# Patient Record
Sex: Female | Born: 1975 | Race: Black or African American | Hispanic: No | Marital: Single | Smoking: Never smoker
Health system: Southern US, Community
[De-identification: ages and names within clinical notes are randomized; demographics above are authoritative.]

## PROBLEM LIST (undated history)

## (undated) DIAGNOSIS — A5403 Gonococcal cervicitis, unspecified: Secondary | ICD-10-CM

## (undated) DIAGNOSIS — L301 Dyshidrosis [pompholyx]: Secondary | ICD-10-CM

## (undated) DIAGNOSIS — N3941 Urge incontinence: Secondary | ICD-10-CM

## (undated) DIAGNOSIS — F32A Depression, unspecified: Secondary | ICD-10-CM

## (undated) DIAGNOSIS — L700 Acne vulgaris: Secondary | ICD-10-CM

## (undated) DIAGNOSIS — N760 Acute vaginitis: Secondary | ICD-10-CM

## (undated) DIAGNOSIS — A64 Unspecified sexually transmitted disease: Secondary | ICD-10-CM

## (undated) DIAGNOSIS — F329 Major depressive disorder, single episode, unspecified: Secondary | ICD-10-CM

## (undated) DIAGNOSIS — B9689 Other specified bacterial agents as the cause of diseases classified elsewhere: Secondary | ICD-10-CM

## (undated) HISTORY — DX: Unspecified sexually transmitted disease: A64

## (undated) HISTORY — DX: Depression, unspecified: F32.A

## (undated) HISTORY — DX: Acne vulgaris: L70.0

## (undated) HISTORY — DX: Other specified bacterial agents as the cause of diseases classified elsewhere: B96.89

## (undated) HISTORY — DX: Major depressive disorder, single episode, unspecified: F32.9

## (undated) HISTORY — DX: Dyshidrosis (pompholyx): L30.1

## (undated) HISTORY — DX: Acute vaginitis: N76.0

## (undated) HISTORY — DX: Urge incontinence: N39.41

## (undated) HISTORY — DX: Gonococcal cervicitis, unspecified: A54.03

---

## 1998-05-23 ENCOUNTER — Inpatient Hospital Stay (HOSPITAL_COMMUNITY): Admission: AD | Admit: 1998-05-23 | Discharge: 1998-05-23 | Payer: Self-pay | Admitting: *Deleted

## 1998-05-25 ENCOUNTER — Inpatient Hospital Stay (HOSPITAL_COMMUNITY): Admission: RE | Admit: 1998-05-25 | Discharge: 1998-05-25 | Payer: Self-pay | Admitting: Obstetrics & Gynecology

## 1998-12-24 ENCOUNTER — Inpatient Hospital Stay (HOSPITAL_COMMUNITY): Admission: AD | Admit: 1998-12-24 | Discharge: 1998-12-24 | Payer: Self-pay | Admitting: Obstetrics & Gynecology

## 1999-01-08 ENCOUNTER — Inpatient Hospital Stay (HOSPITAL_COMMUNITY): Admission: AD | Admit: 1999-01-08 | Discharge: 1999-01-08 | Payer: Self-pay | Admitting: Obstetrics & Gynecology

## 1999-01-08 ENCOUNTER — Encounter: Payer: Self-pay | Admitting: Obstetrics & Gynecology

## 1999-01-11 ENCOUNTER — Inpatient Hospital Stay (HOSPITAL_COMMUNITY): Admission: AD | Admit: 1999-01-11 | Discharge: 1999-01-11 | Payer: Self-pay | Admitting: Obstetrics

## 1999-01-20 ENCOUNTER — Emergency Department (HOSPITAL_COMMUNITY): Admission: EM | Admit: 1999-01-20 | Discharge: 1999-01-20 | Payer: Self-pay | Admitting: Emergency Medicine

## 1999-02-10 ENCOUNTER — Inpatient Hospital Stay (HOSPITAL_COMMUNITY): Admission: AD | Admit: 1999-02-10 | Discharge: 1999-02-10 | Payer: Self-pay | Admitting: Obstetrics

## 1999-03-21 ENCOUNTER — Other Ambulatory Visit: Admission: RE | Admit: 1999-03-21 | Discharge: 1999-03-21 | Payer: Self-pay | Admitting: Obstetrics & Gynecology

## 1999-04-29 ENCOUNTER — Ambulatory Visit (HOSPITAL_COMMUNITY): Admission: RE | Admit: 1999-04-29 | Discharge: 1999-04-29 | Payer: Self-pay | Admitting: *Deleted

## 1999-06-01 ENCOUNTER — Observation Stay (HOSPITAL_COMMUNITY): Admission: AD | Admit: 1999-06-01 | Discharge: 1999-06-02 | Payer: Self-pay | Admitting: Obstetrics

## 1999-06-02 ENCOUNTER — Encounter: Payer: Self-pay | Admitting: *Deleted

## 1999-07-21 ENCOUNTER — Inpatient Hospital Stay (HOSPITAL_COMMUNITY): Admission: AD | Admit: 1999-07-21 | Discharge: 1999-07-21 | Payer: Self-pay | Admitting: *Deleted

## 1999-08-15 ENCOUNTER — Observation Stay (HOSPITAL_COMMUNITY): Admission: AD | Admit: 1999-08-15 | Discharge: 1999-08-15 | Payer: Self-pay | Admitting: Obstetrics & Gynecology

## 1999-08-15 ENCOUNTER — Encounter: Payer: Self-pay | Admitting: Obstetrics & Gynecology

## 1999-08-24 ENCOUNTER — Inpatient Hospital Stay (HOSPITAL_COMMUNITY): Admission: AD | Admit: 1999-08-24 | Discharge: 1999-08-24 | Payer: Self-pay | Admitting: Obstetrics & Gynecology

## 1999-08-26 ENCOUNTER — Inpatient Hospital Stay (HOSPITAL_COMMUNITY): Admission: AD | Admit: 1999-08-26 | Discharge: 1999-09-01 | Payer: Self-pay | Admitting: Obstetrics & Gynecology

## 1999-08-30 ENCOUNTER — Encounter: Payer: Self-pay | Admitting: Obstetrics & Gynecology

## 1999-09-17 ENCOUNTER — Emergency Department (HOSPITAL_COMMUNITY): Admission: EM | Admit: 1999-09-17 | Discharge: 1999-09-17 | Payer: Self-pay | Admitting: Emergency Medicine

## 2000-01-04 ENCOUNTER — Encounter: Admission: RE | Admit: 2000-01-04 | Discharge: 2000-01-04 | Payer: Self-pay | Admitting: Obstetrics & Gynecology

## 2000-08-16 ENCOUNTER — Emergency Department (HOSPITAL_COMMUNITY): Admission: EM | Admit: 2000-08-16 | Discharge: 2000-08-16 | Payer: Self-pay | Admitting: Emergency Medicine

## 2001-02-27 ENCOUNTER — Emergency Department (HOSPITAL_COMMUNITY): Admission: EM | Admit: 2001-02-27 | Discharge: 2001-02-27 | Payer: Self-pay

## 2001-03-20 ENCOUNTER — Encounter (INDEPENDENT_AMBULATORY_CARE_PROVIDER_SITE_OTHER): Payer: Self-pay | Admitting: *Deleted

## 2001-03-20 ENCOUNTER — Encounter: Admission: RE | Admit: 2001-03-20 | Discharge: 2001-03-20 | Payer: Self-pay | Admitting: Obstetrics & Gynecology

## 2001-05-08 ENCOUNTER — Encounter: Admission: RE | Admit: 2001-05-08 | Discharge: 2001-05-08 | Payer: Self-pay | Admitting: Obstetrics & Gynecology

## 2001-05-23 ENCOUNTER — Emergency Department (HOSPITAL_COMMUNITY): Admission: EM | Admit: 2001-05-23 | Discharge: 2001-05-23 | Payer: Self-pay | Admitting: Emergency Medicine

## 2001-08-11 ENCOUNTER — Emergency Department (HOSPITAL_COMMUNITY): Admission: EM | Admit: 2001-08-11 | Discharge: 2001-08-11 | Payer: Self-pay | Admitting: Emergency Medicine

## 2001-08-22 ENCOUNTER — Inpatient Hospital Stay (HOSPITAL_COMMUNITY): Admission: AD | Admit: 2001-08-22 | Discharge: 2001-08-22 | Payer: Self-pay | Admitting: Obstetrics

## 2001-11-21 ENCOUNTER — Emergency Department (HOSPITAL_COMMUNITY): Admission: EM | Admit: 2001-11-21 | Discharge: 2001-11-21 | Payer: Self-pay | Admitting: *Deleted

## 2001-11-21 ENCOUNTER — Encounter: Payer: Self-pay | Admitting: Orthopedic Surgery

## 2001-11-25 ENCOUNTER — Inpatient Hospital Stay (HOSPITAL_COMMUNITY): Admission: RE | Admit: 2001-11-25 | Discharge: 2001-11-26 | Payer: Self-pay | Admitting: Orthopedic Surgery

## 2001-12-04 ENCOUNTER — Encounter: Admission: RE | Admit: 2001-12-04 | Discharge: 2002-02-18 | Payer: Self-pay | Admitting: Orthopedic Surgery

## 2002-01-21 ENCOUNTER — Encounter: Payer: Self-pay | Admitting: Family Medicine

## 2002-01-21 ENCOUNTER — Encounter: Admission: RE | Admit: 2002-01-21 | Discharge: 2002-01-21 | Payer: Self-pay | Admitting: Family Medicine

## 2002-06-03 ENCOUNTER — Emergency Department (HOSPITAL_COMMUNITY): Admission: EM | Admit: 2002-06-03 | Discharge: 2002-06-03 | Payer: Self-pay | Admitting: Emergency Medicine

## 2003-04-18 ENCOUNTER — Inpatient Hospital Stay (HOSPITAL_COMMUNITY): Admission: AD | Admit: 2003-04-18 | Discharge: 2003-04-18 | Payer: Self-pay | Admitting: *Deleted

## 2003-06-12 ENCOUNTER — Encounter: Admission: RE | Admit: 2003-06-12 | Discharge: 2003-06-12 | Payer: Self-pay | Admitting: Obstetrics and Gynecology

## 2003-06-23 ENCOUNTER — Ambulatory Visit (HOSPITAL_COMMUNITY): Admission: RE | Admit: 2003-06-23 | Discharge: 2003-06-23 | Payer: Self-pay | Admitting: Family Medicine

## 2003-06-24 ENCOUNTER — Inpatient Hospital Stay (HOSPITAL_COMMUNITY): Admission: AD | Admit: 2003-06-24 | Discharge: 2003-06-24 | Payer: Self-pay | Admitting: *Deleted

## 2003-10-12 ENCOUNTER — Inpatient Hospital Stay (HOSPITAL_COMMUNITY): Admission: AD | Admit: 2003-10-12 | Discharge: 2003-10-12 | Payer: Self-pay | Admitting: Family Medicine

## 2003-11-16 ENCOUNTER — Inpatient Hospital Stay (HOSPITAL_COMMUNITY): Admission: AD | Admit: 2003-11-16 | Discharge: 2003-11-17 | Payer: Self-pay | Admitting: Family Medicine

## 2004-01-10 ENCOUNTER — Emergency Department (HOSPITAL_COMMUNITY): Admission: EM | Admit: 2004-01-10 | Discharge: 2004-01-10 | Payer: Self-pay | Admitting: Emergency Medicine

## 2004-04-16 ENCOUNTER — Inpatient Hospital Stay (HOSPITAL_COMMUNITY): Admission: AD | Admit: 2004-04-16 | Discharge: 2004-04-16 | Payer: Self-pay | Admitting: *Deleted

## 2004-06-22 HISTORY — PX: TUBAL LIGATION: SHX77

## 2005-05-05 ENCOUNTER — Emergency Department (HOSPITAL_COMMUNITY): Admission: EM | Admit: 2005-05-05 | Discharge: 2005-05-05 | Payer: Self-pay | Admitting: Emergency Medicine

## 2005-05-08 ENCOUNTER — Emergency Department (HOSPITAL_COMMUNITY): Admission: EM | Admit: 2005-05-08 | Discharge: 2005-05-08 | Payer: Self-pay | Admitting: Emergency Medicine

## 2005-05-19 ENCOUNTER — Inpatient Hospital Stay (HOSPITAL_COMMUNITY): Admission: EM | Admit: 2005-05-19 | Discharge: 2005-05-23 | Payer: Self-pay | Admitting: Emergency Medicine

## 2006-01-12 ENCOUNTER — Ambulatory Visit (HOSPITAL_COMMUNITY): Admission: RE | Admit: 2006-01-12 | Discharge: 2006-01-12 | Payer: Self-pay | Admitting: Internal Medicine

## 2006-01-12 ENCOUNTER — Ambulatory Visit: Payer: Self-pay | Admitting: Internal Medicine

## 2006-05-10 ENCOUNTER — Ambulatory Visit: Payer: Self-pay | Admitting: Hospitalist

## 2006-07-28 ENCOUNTER — Encounter (INDEPENDENT_AMBULATORY_CARE_PROVIDER_SITE_OTHER): Payer: Self-pay | Admitting: Infectious Diseases

## 2006-07-28 ENCOUNTER — Ambulatory Visit: Payer: Self-pay | Admitting: Internal Medicine

## 2006-07-28 ENCOUNTER — Encounter (INDEPENDENT_AMBULATORY_CARE_PROVIDER_SITE_OTHER): Payer: Self-pay | Admitting: *Deleted

## 2006-07-28 LAB — CONVERTED CEMR LAB
Beta hcg, urine, semiquantitative: NEGATIVE
Chlamydia, DNA Probe: NEGATIVE
GC Probe Amp, Genital: NEGATIVE
Pap Smear: NORMAL

## 2006-07-29 ENCOUNTER — Encounter (INDEPENDENT_AMBULATORY_CARE_PROVIDER_SITE_OTHER): Payer: Self-pay | Admitting: Infectious Diseases

## 2006-08-17 ENCOUNTER — Encounter (INDEPENDENT_AMBULATORY_CARE_PROVIDER_SITE_OTHER): Payer: Self-pay | Admitting: Infectious Diseases

## 2006-08-17 DIAGNOSIS — R51 Headache: Secondary | ICD-10-CM

## 2006-08-17 DIAGNOSIS — A5901 Trichomonal vulvovaginitis: Secondary | ICD-10-CM

## 2006-08-17 DIAGNOSIS — N760 Acute vaginitis: Secondary | ICD-10-CM | POA: Insufficient documentation

## 2006-08-17 DIAGNOSIS — R519 Headache, unspecified: Secondary | ICD-10-CM | POA: Insufficient documentation

## 2006-10-20 ENCOUNTER — Ambulatory Visit: Payer: Self-pay | Admitting: Internal Medicine

## 2006-10-21 ENCOUNTER — Encounter (INDEPENDENT_AMBULATORY_CARE_PROVIDER_SITE_OTHER): Payer: Self-pay | Admitting: *Deleted

## 2006-10-21 LAB — CONVERTED CEMR LAB
Candida species: NEGATIVE
Trichomonal Vaginitis: NEGATIVE

## 2006-10-24 ENCOUNTER — Encounter (INDEPENDENT_AMBULATORY_CARE_PROVIDER_SITE_OTHER): Payer: Self-pay | Admitting: *Deleted

## 2006-10-24 ENCOUNTER — Telehealth: Payer: Self-pay | Admitting: *Deleted

## 2007-01-10 ENCOUNTER — Ambulatory Visit: Payer: Self-pay | Admitting: Hospitalist

## 2007-01-10 ENCOUNTER — Encounter (INDEPENDENT_AMBULATORY_CARE_PROVIDER_SITE_OTHER): Payer: Self-pay | Admitting: *Deleted

## 2007-01-10 DIAGNOSIS — N898 Other specified noninflammatory disorders of vagina: Secondary | ICD-10-CM | POA: Insufficient documentation

## 2007-01-10 LAB — CONVERTED CEMR LAB
GC Probe Amp, Genital: NEGATIVE
Gardnerella vaginalis: POSITIVE — AB

## 2007-02-04 ENCOUNTER — Emergency Department (HOSPITAL_COMMUNITY): Admission: EM | Admit: 2007-02-04 | Discharge: 2007-02-04 | Payer: Self-pay | Admitting: Family Medicine

## 2007-08-19 ENCOUNTER — Emergency Department (HOSPITAL_COMMUNITY): Admission: EM | Admit: 2007-08-19 | Discharge: 2007-08-19 | Payer: Self-pay | Admitting: Emergency Medicine

## 2007-10-09 ENCOUNTER — Emergency Department (HOSPITAL_COMMUNITY): Admission: EM | Admit: 2007-10-09 | Discharge: 2007-10-09 | Payer: Self-pay | Admitting: Emergency Medicine

## 2007-11-15 ENCOUNTER — Ambulatory Visit: Payer: Self-pay | Admitting: Internal Medicine

## 2007-11-21 ENCOUNTER — Encounter: Admission: RE | Admit: 2007-11-21 | Discharge: 2007-12-24 | Payer: Self-pay | Admitting: Infectious Diseases

## 2007-12-10 ENCOUNTER — Telehealth: Payer: Self-pay | Admitting: *Deleted

## 2007-12-12 ENCOUNTER — Encounter (INDEPENDENT_AMBULATORY_CARE_PROVIDER_SITE_OTHER): Payer: Self-pay | Admitting: Infectious Diseases

## 2007-12-12 ENCOUNTER — Encounter (INDEPENDENT_AMBULATORY_CARE_PROVIDER_SITE_OTHER): Payer: Self-pay | Admitting: Internal Medicine

## 2007-12-12 ENCOUNTER — Ambulatory Visit: Payer: Self-pay | Admitting: Hospitalist

## 2007-12-12 LAB — CONVERTED CEMR LAB
Gardnerella vaginalis: POSITIVE — AB
Trichomonal Vaginitis: NEGATIVE

## 2007-12-13 LAB — CONVERTED CEMR LAB
Chlamydia, DNA Probe: NEGATIVE
GC Probe Amp, Genital: POSITIVE — AB

## 2007-12-26 ENCOUNTER — Ambulatory Visit: Payer: Self-pay | Admitting: Infectious Disease

## 2007-12-26 ENCOUNTER — Encounter (INDEPENDENT_AMBULATORY_CARE_PROVIDER_SITE_OTHER): Payer: Self-pay | Admitting: Infectious Diseases

## 2007-12-26 ENCOUNTER — Encounter (INDEPENDENT_AMBULATORY_CARE_PROVIDER_SITE_OTHER): Payer: Self-pay | Admitting: Internal Medicine

## 2007-12-26 DIAGNOSIS — A64 Unspecified sexually transmitted disease: Secondary | ICD-10-CM | POA: Insufficient documentation

## 2007-12-26 LAB — CONVERTED CEMR LAB
Hep B Core Total Ab: NEGATIVE
Hep B S Ab: NEGATIVE
Hepatitis B Surface Ag: NEGATIVE

## 2007-12-27 LAB — CONVERTED CEMR LAB: Gardnerella vaginalis: POSITIVE — AB

## 2008-01-09 ENCOUNTER — Telehealth (INDEPENDENT_AMBULATORY_CARE_PROVIDER_SITE_OTHER): Payer: Self-pay | Admitting: Infectious Diseases

## 2008-01-22 ENCOUNTER — Ambulatory Visit: Payer: Self-pay | Admitting: Internal Medicine

## 2008-01-22 ENCOUNTER — Encounter (INDEPENDENT_AMBULATORY_CARE_PROVIDER_SITE_OTHER): Payer: Self-pay | Admitting: *Deleted

## 2008-01-22 DIAGNOSIS — B353 Tinea pedis: Secondary | ICD-10-CM | POA: Insufficient documentation

## 2008-01-22 LAB — CONVERTED CEMR LAB
GC Probe Amp, Genital: NEGATIVE
Gardnerella vaginalis: POSITIVE — AB
Protein, U semiquant: NEGATIVE
Specific Gravity, Urine: >=1.03
Trichomonal Vaginitis: NEGATIVE
Urobilinogen, UA: 1

## 2008-01-23 ENCOUNTER — Telehealth (INDEPENDENT_AMBULATORY_CARE_PROVIDER_SITE_OTHER): Payer: Self-pay | Admitting: *Deleted

## 2008-02-26 ENCOUNTER — Emergency Department (HOSPITAL_COMMUNITY): Admission: EM | Admit: 2008-02-26 | Discharge: 2008-02-26 | Payer: Self-pay | Admitting: Emergency Medicine

## 2008-04-17 ENCOUNTER — Telehealth (INDEPENDENT_AMBULATORY_CARE_PROVIDER_SITE_OTHER): Payer: Self-pay | Admitting: Internal Medicine

## 2008-05-06 ENCOUNTER — Encounter (INDEPENDENT_AMBULATORY_CARE_PROVIDER_SITE_OTHER): Payer: Self-pay | Admitting: *Deleted

## 2008-05-06 ENCOUNTER — Ambulatory Visit: Payer: Self-pay | Admitting: Internal Medicine

## 2008-05-06 LAB — CONVERTED CEMR LAB

## 2008-06-05 ENCOUNTER — Encounter (INDEPENDENT_AMBULATORY_CARE_PROVIDER_SITE_OTHER): Payer: Self-pay | Admitting: Internal Medicine

## 2008-06-13 ENCOUNTER — Telehealth (INDEPENDENT_AMBULATORY_CARE_PROVIDER_SITE_OTHER): Payer: Self-pay | Admitting: Internal Medicine

## 2008-06-13 ENCOUNTER — Encounter (INDEPENDENT_AMBULATORY_CARE_PROVIDER_SITE_OTHER): Payer: Self-pay | Admitting: Internal Medicine

## 2008-07-24 ENCOUNTER — Telehealth (INDEPENDENT_AMBULATORY_CARE_PROVIDER_SITE_OTHER): Payer: Self-pay | Admitting: Internal Medicine

## 2008-08-01 ENCOUNTER — Ambulatory Visit: Payer: Self-pay | Admitting: Internal Medicine

## 2008-08-01 ENCOUNTER — Encounter (INDEPENDENT_AMBULATORY_CARE_PROVIDER_SITE_OTHER): Payer: Self-pay | Admitting: *Deleted

## 2008-08-06 LAB — CONVERTED CEMR LAB: Candida species: NEGATIVE

## 2008-09-03 ENCOUNTER — Telehealth (INDEPENDENT_AMBULATORY_CARE_PROVIDER_SITE_OTHER): Payer: Self-pay | Admitting: Internal Medicine

## 2008-10-03 ENCOUNTER — Telehealth (INDEPENDENT_AMBULATORY_CARE_PROVIDER_SITE_OTHER): Payer: Self-pay | Admitting: Internal Medicine

## 2008-11-11 ENCOUNTER — Ambulatory Visit: Payer: Self-pay | Admitting: Internal Medicine

## 2008-11-11 ENCOUNTER — Encounter (INDEPENDENT_AMBULATORY_CARE_PROVIDER_SITE_OTHER): Payer: Self-pay | Admitting: Internal Medicine

## 2008-11-11 ENCOUNTER — Encounter (INDEPENDENT_AMBULATORY_CARE_PROVIDER_SITE_OTHER): Payer: Self-pay | Admitting: *Deleted

## 2008-11-11 ENCOUNTER — Encounter: Payer: Self-pay | Admitting: Internal Medicine

## 2008-11-11 LAB — CONVERTED CEMR LAB
Chlamydia, DNA Probe: NEGATIVE
GC Probe Amp, Genital: NEGATIVE

## 2008-11-13 LAB — CONVERTED CEMR LAB
Gardnerella vaginalis: POSITIVE — AB
Trichomonal Vaginitis: POSITIVE — AB

## 2009-01-22 ENCOUNTER — Ambulatory Visit: Payer: Self-pay | Admitting: Internal Medicine

## 2009-02-03 ENCOUNTER — Encounter (INDEPENDENT_AMBULATORY_CARE_PROVIDER_SITE_OTHER): Payer: Self-pay | Admitting: Internal Medicine

## 2009-02-03 ENCOUNTER — Ambulatory Visit: Payer: Self-pay | Admitting: Internal Medicine

## 2009-02-03 LAB — CONVERTED CEMR LAB
Bilirubin Urine: NEGATIVE
Chlamydia, DNA Probe: NEGATIVE
GC Probe Amp, Genital: NEGATIVE
Hemoglobin, Urine: NEGATIVE
Ketones, ur: NEGATIVE mg/dL
Leukocytes, UA: NEGATIVE
Nitrite: NEGATIVE
Protein, ur: NEGATIVE mg/dL
Specific Gravity, Urine: 1.034 — ABNORMAL HIGH
Urine Glucose: NEGATIVE mg/dL
Urobilinogen, UA: 1
pH: 6

## 2009-02-17 LAB — CONVERTED CEMR LAB
Candida species: NEGATIVE
Trichomonal Vaginitis: NEGATIVE

## 2009-04-09 ENCOUNTER — Ambulatory Visit: Payer: Self-pay | Admitting: Internal Medicine

## 2009-04-13 LAB — CONVERTED CEMR LAB
Candida species: NEGATIVE
GC Probe Amp, Urine: NEGATIVE
Gardnerella vaginalis: POSITIVE — AB

## 2009-04-14 ENCOUNTER — Telehealth: Payer: Self-pay | Admitting: *Deleted

## 2009-04-14 ENCOUNTER — Telehealth: Payer: Self-pay | Admitting: Internal Medicine

## 2009-04-17 ENCOUNTER — Ambulatory Visit: Payer: Self-pay | Admitting: Internal Medicine

## 2009-06-03 ENCOUNTER — Emergency Department (HOSPITAL_COMMUNITY): Admission: EM | Admit: 2009-06-03 | Discharge: 2009-06-03 | Payer: Self-pay | Admitting: Emergency Medicine

## 2009-06-03 ENCOUNTER — Other Ambulatory Visit: Payer: Self-pay | Admitting: Obstetrics & Gynecology

## 2009-06-03 ENCOUNTER — Ambulatory Visit: Payer: Self-pay | Admitting: Advanced Practice Midwife

## 2009-06-05 ENCOUNTER — Ambulatory Visit: Payer: Self-pay | Admitting: Internal Medicine

## 2009-06-05 DIAGNOSIS — N390 Urinary tract infection, site not specified: Secondary | ICD-10-CM

## 2009-08-20 ENCOUNTER — Ambulatory Visit: Payer: Self-pay | Admitting: Internal Medicine

## 2009-08-21 ENCOUNTER — Encounter (INDEPENDENT_AMBULATORY_CARE_PROVIDER_SITE_OTHER): Payer: Self-pay | Admitting: Internal Medicine

## 2009-08-21 LAB — CONVERTED CEMR LAB
Bilirubin Urine: NEGATIVE
Protein, ur: NEGATIVE mg/dL
Urine Glucose: NEGATIVE mg/dL

## 2009-10-08 ENCOUNTER — Telehealth (INDEPENDENT_AMBULATORY_CARE_PROVIDER_SITE_OTHER): Payer: Self-pay | Admitting: Internal Medicine

## 2009-10-12 ENCOUNTER — Telehealth (INDEPENDENT_AMBULATORY_CARE_PROVIDER_SITE_OTHER): Payer: Self-pay | Admitting: Internal Medicine

## 2009-12-14 ENCOUNTER — Ambulatory Visit: Payer: Self-pay | Admitting: Internal Medicine

## 2009-12-14 LAB — CONVERTED CEMR LAB
Candida species: NEGATIVE
GC Probe Amp, Genital: NEGATIVE

## 2009-12-14 LAB — HM PAP SMEAR: HM Pap smear: NORMAL

## 2009-12-15 LAB — CONVERTED CEMR LAB: Pap Smear: NEGATIVE

## 2009-12-23 ENCOUNTER — Telehealth (INDEPENDENT_AMBULATORY_CARE_PROVIDER_SITE_OTHER): Payer: Self-pay | Admitting: Internal Medicine

## 2010-05-26 ENCOUNTER — Ambulatory Visit: Payer: Self-pay | Admitting: Internal Medicine

## 2010-05-26 DIAGNOSIS — F329 Major depressive disorder, single episode, unspecified: Secondary | ICD-10-CM | POA: Insufficient documentation

## 2010-05-26 LAB — CONVERTED CEMR LAB
AST: 10 units/L (ref 0–37)
Alkaline Phosphatase: 42 units/L (ref 39–117)
BUN: 12 mg/dL (ref 6–23)
Bilirubin Urine: NEGATIVE
Creatinine, Ser: 0.75 mg/dL (ref 0.40–1.20)
HCT: 37.1 % (ref 36.0–46.0)
Hemoglobin: 12.5 g/dL (ref 12.0–15.0)
MCHC: 33.7 g/dL (ref 30.0–36.0)
Nitrite: NEGATIVE
Potassium: 4.5 meq/L (ref 3.5–5.3)
RDW: 14.2 % (ref 11.5–15.5)
Specific Gravity, Urine: >=1.03
Urobilinogen, UA: 0.2

## 2010-08-31 ENCOUNTER — Telehealth: Payer: Self-pay | Admitting: *Deleted

## 2010-09-12 ENCOUNTER — Encounter: Payer: Self-pay | Admitting: Hospitalist

## 2010-09-23 NOTE — Progress Notes (Signed)
Summary: phone/gg  Phone Note Call from Patient   Caller: Patient Summary of Call: Pt needs another referral to  dermatoligist, Dr Terri Piedra for skin rash.  She is out of the cream she uses and the referral was in 09. Pt # [phone] Initial call taken by: Merrie Roof RN,  October 08, 2009 4:44 PM  Follow-up for Phone Call       Follow-up by: Jason Coop MD,  October 08, 2009 5:00 PM

## 2010-09-23 NOTE — Progress Notes (Signed)
Summary: med refill/gp  Phone Note Refill Request Message from:  Patient on October 12, 2009 4:35 PM  Pt. request refill on Clobetasol Propionate 0.5% cream.  She said her hands are doing good with the cream.  And does not need Derm. referral since the cream is working.   Method Requested: Electronic Initial call taken by: Chinita Pester RN,  October 12, 2009 4:36 PM  Follow-up for Phone Call       Follow-up by: Jason Coop MD,  October 12, 2009 5:04 PM    New/Updated Medications: CLOBETASOL PROPIONATE 0.05 % CREA (CLOBETASOL PROPIONATE) apply once daily on the affected areas Prescriptions: CLOBETASOL PROPIONATE 0.05 % CREA (CLOBETASOL PROPIONATE) apply once daily on the affected areas  #1 x 0   Entered and Authorized by:   Jason Coop MD   Signed by:   Jason Coop MD on 10/12/2009   Method used:   Electronically to        [facility] [id number] (retail)       [address] [place], [place]  [address]       Ph: [phone]       Fax: [phone]   RxID:   [id number]

## 2010-09-23 NOTE — Progress Notes (Signed)
Summary: med refill/gp  Phone Note Refill Request Message from:  Fax from Pharmacy on Dec 23, 2009 11:21 AM  Refills Requested: Medication #1:  CLOBETASOL PROPIONATE 0.05 % CREA apply once daily on the affected areas   Last Refilled: 09/23/2009  Method Requested: Electronic Initial call taken by: Chinita Pester RN,  Dec 23, 2009 11:21 AM  Follow-up for Phone Call       Follow-up by: Jason Coop MD,  Dec 24, 2009 10:03 AM    Prescriptions: CLOBETASOL PROPIONATE 0.05 % CREA (CLOBETASOL PROPIONATE) apply once daily on the affected areas  #1 x 0   Entered and Authorized by:   Jason Coop MD   Signed by:   Jason Coop MD on 12/24/2009   Method used:   Electronically to        [facility] [id number] (retail)       [address] [place], [place]  [address]       Ph: [phone]       Fax: [phone]   RxID:   [id number]

## 2010-09-23 NOTE — Progress Notes (Signed)
Summary: phone/gg  Phone Note From Pharmacy   Summary of Call: pharmacy would like to change FLAGYL ER 750 MG XR24H-TAB (METRONIDAZOLE) Take 1 tablet by mouth once a day  #7   to metronidazole 500 mg 1 & 1/2    twice a day .      Medicare will not cover the ER. Pt did not have enough money to pay for meds.  She had to get new medicaid card. Still having d/c   Initial call taken by: Merrie Roof RN,  August 31, 2010 10:43 AM  Follow-up for Phone Call        I changed rx as requested - nurse to complete. Follow-up by: Margarito Liner MD,  August 31, 2010 12:30 PM  Additional Follow-up for Phone Call Additional follow up Details #1::        Phone call completed, Pharmacist called Additional Follow-up by: Merrie Roof RN,  August 31, 2010 12:34 PM    New/Updated Medications: METRONIDAZOLE 500 MG TABS (METRONIDAZOLE) Take 1 tablet by mouth two times a day for 7 days.  Prescriptions: METRONIDAZOLE 500 MG TABS (METRONIDAZOLE) Take 1 tablet by mouth two times a day for 7 days.  #14 x 0   Entered and Authorized by:   Margarito Liner MD   Signed by:   Margarito Liner MD on 08/31/2010   Method used:   Telephoned to ...       [facility] [id number] (retail)       [address] [place], [place]  [address]       Ph: [phone]       Fax: [phone]   RxID:   [id number]
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
## 2010-11-12 ENCOUNTER — Ambulatory Visit (INDEPENDENT_AMBULATORY_CARE_PROVIDER_SITE_OTHER): Payer: Medicaid Other | Admitting: Internal Medicine

## 2010-11-12 ENCOUNTER — Encounter: Payer: Self-pay | Admitting: Internal Medicine

## 2010-11-12 VITALS — BP 128/75 | HR 83 | Temp 98.1°F | Ht 64.0 in | Wt 146.2 lb

## 2010-11-12 DIAGNOSIS — N760 Acute vaginitis: Secondary | ICD-10-CM

## 2010-11-12 DIAGNOSIS — A5403 Gonococcal cervicitis, unspecified: Secondary | ICD-10-CM

## 2010-11-12 DIAGNOSIS — A64 Unspecified sexually transmitted disease: Secondary | ICD-10-CM

## 2010-11-12 MED ORDER — CLOBETASOL PROPIONATE 0.05 % EX CREA: TOPICAL_CREAM | Freq: Every day | CUTANEOUS | Status: DC

## 2010-11-12 MED ORDER — METRONIDAZOLE 0.75 % VA GEL: 1.0000 | Freq: Every day | VAGINAL | Status: AC

## 2010-11-12 MED ORDER — HYDROCODONE-ACETAMINOPHEN 5-500 MG PO TABS: 1.0000 | ORAL_TABLET | Freq: Three times a day (TID) | ORAL | Status: DC | PRN

## 2010-11-12 NOTE — Assessment & Plan Note (Signed)
She has hx of multiple BV and  did not complete flagyl treatment. Vaginal exam shows whitish discharge, will give her flagy vaginal gel for 5 days.  Also will check GC/Chlamydial to rule out STD.

## 2010-11-12 NOTE — Assessment & Plan Note (Addendum)
She is a high risk for STD and hx of multiple BV, GC and CM. Vaginal exam shows whitish discharge. Since she has been has this and did not complete flagyl treatment, will give her flagyl vaginal gel for 5 days.  Will check GC/Chlamydial and advised her safe sex.   Addendum: Her vaginal discharge had positive chlamydia, so will start azithromycin 1 g for one dose.

## 2010-11-12 NOTE — Assessment & Plan Note (Signed)
Will check GC of vaginal discharge.

## 2010-11-12 NOTE — Patient Instructions (Signed)
Please take all your medications as instructed in your instructions.   We will call you if any abnormal lab results.      
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
## 2010-11-13 LAB — WET PREP BY MOLECULAR PROBE: Gardnerella vaginalis: POSITIVE — AB

## 2010-11-15 MED ORDER — AZITHROMYCIN 500 MG PO TABS: ORAL_TABLET | ORAL | Status: AC

## 2010-11-15 NOTE — Progress Notes (Signed)
Addended by: Jackson Latino on: 11/15/2010 01:37 PM   Modules accepted: Orders

## 2010-11-25 LAB — COMPREHENSIVE METABOLIC PANEL
AST: 16 U/L (ref 0–37)
BUN: 9 mg/dL (ref 6–23)
CO2: 31 mEq/L (ref 19–32)
Calcium: 9.2 mg/dL (ref 8.4–10.5)
Creatinine, Ser: 0.82 mg/dL (ref 0.4–1.2)
GFR calc Af Amer: >60 mL/min (ref >60)
GFR calc non Af Amer: >60 mL/min (ref >60)

## 2010-11-25 LAB — WET PREP, GENITAL
Clue Cells Wet Prep HPF POC: NONE SEEN
Trich, Wet Prep: NONE SEEN
Yeast Wet Prep HPF POC: NONE SEEN

## 2010-11-25 LAB — DIFFERENTIAL
Eosinophils Relative: 2 % (ref 0–5)
Lymphocytes Relative: 37 % (ref 12–46)
Lymphs Abs: 1.1 10*3/uL (ref 0.7–4.0)
Monocytes Relative: 10 % (ref 3–12)
Neutrophils Relative %: 50 % (ref 43–77)

## 2010-11-25 LAB — URINALYSIS, ROUTINE W REFLEX MICROSCOPIC
Ketones, ur: NEGATIVE mg/dL
Leukocytes, UA: NEGATIVE
Nitrite: POSITIVE — AB
Specific Gravity, Urine: >1.03 — ABNORMAL HIGH (ref 1.005–1.030)
pH: 5.5 (ref 5.0–8.0)

## 2010-11-25 LAB — GC/CHLAMYDIA PROBE AMP, GENITAL: GC Probe Amp, Genital: NEGATIVE

## 2010-11-25 LAB — CBC
MCHC: 34 g/dL (ref 30.0–36.0)
MCV: 89.5 fL (ref 78.0–100.0)
RBC: 4.46 MIL/uL (ref 3.87–5.11)

## 2010-11-25 LAB — URINE MICROSCOPIC-ADD ON

## 2010-12-05 ENCOUNTER — Encounter: Payer: Self-pay | Admitting: Internal Medicine
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
## 2011-01-07 NOTE — H&P (Signed)
[facility]  Patient:    [name], [name]                        MRN: [id number] Adm. Date:  [id number] Disc. Date: [id number] Attending:  Armanda Heritage                         History and Physical  CHIEF COMPLAINT:              Patient is a 35 year old para 2 who desires a sterilization procedure.  HISTORY OF PRESENT ILLNESS:   At present the patient is using Depo-Provera. She has a history of Norplant usage.  She is single.  The risks, benefits, and alternative forms of management were reviewed with the patient and informed consent was obtained.  She was counseled regarding the failure rate of 4-8 per 1000 cases with subsequent risk of an ectopic gestation.  ALLERGIES:                    No known drug allergies.  MEDICATIONS:                  None.  PAST OBSTETRICAL HISTORY:     She is status post two NSVDs.  PAST GYNECOLOGICAL HISTORY:   She has a history of pelvic inflammatory disease.  PAST MEDICAL HISTORY:         She denies.  PAST SURGICAL HISTORY:        As above.  FAMILY HISTORY:               Noncontributory.  SOCIAL HISTORY:               No tobacco, ethanol, or drug use.  PHYSICAL EXAMINATION  VITAL SIGNS:                  Pulse 93, blood pressure 128/66, weight 160.4 pounds.  GENERAL:                      Well-developed, well-nourished, no apparent distress.  HEENT:                        Normocephalic, atraumatic.  NECK:                         Supple.  LUNGS:                        Clear to auscultation.  HEART:                        Regular rate and rhythm.  ABDOMEN:                      Soft, nontender.  No organomegaly.  PELVIC:                       The external female genitalia are normal appearing.  On speculum examination there is scant white discharge.  Pap smear, GC/chlamydia probes, and a wet prep were sent.  Cervix was somewhat friable.  On bimanual examination the uterus is small, anteverted,  nontender. Adnexa are nonpalpable, nontender.  EXTREMITIES:                  No cyanosis, clubbing, or edema.  SKIN:                         Without rash.  ASSESSMENT:                   Multiparity, desires sterilization.  PLAN:                         Check Pap smear, GC/chlamydia probes, wet prep done today.  Procedure is laparoscopic bilateral tubal ligation. DD:  03/20/01 TD:  03/20/01 Job: [id number]
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
## 2011-01-07 NOTE — Op Note (Signed)
[facility]  Patient:    [name], [name] Visit Number: [id number] MRN: [id number]          Service Type: EMS Location: MINO Attending Physician:  Armanda Heritage Dictated by:   Roseanna Rainbow, M.D. Proc. Date: 05/03/01 Admit Date:  02/27/2001 Discharge Date: 02/27/2001                             Operative Report  NO DICTATION Dictated by:   Roseanna Rainbow, M.D. Attending Physician:  Armanda Heritage DD:  05/03/01 TD:  05/03/01 Job: [id number]

## 2011-01-07 NOTE — Discharge Summary (Signed)
[name], [name]               ACCOUNT NO.:  [id number]   MEDICAL RECORD NO.:  [id number]          PATIENT TYPE:  INP   LOCATION:  1314                         FACILITY:  [facility]   PHYSICIAN:  [name], M.D.      DATE OF BIRTH:  [birth year]   DATE OF ADMISSION:  05/19/2005  DATE OF DISCHARGE:  05/23/2005                                 DISCHARGE SUMMARY   PRIMARY CARE PHYSICIAN:  [name], M.D.   DISCHARGE DIAGNOSES:  1.  Acute suppurative otitis externi.  2.  Normocytic anemia.  3.  Transient hypertension.  4.  Transient hypokalemia.   DISCHARGE MEDICATIONS:  1.  Ciprodex otic 5 drops twice a day in the right ear for 3 whole days.  2.  Avelox 400 mg daily for 3 more days.  3.  Tylenol p.r.n.  4.  Ibuprofen p.r.n.   DISPOSITION:  The patient will be discharged in much better health with no  discharge from the ear. Still has some mild earache but mainly residual. She  will be scheduled to have an appointment with Dr. Jearld Ellis and Dr. Parke Ellis in 2-3  weeks.   PROCEDURES PERFORMED:  None.   CONSULTATIONS:  Dr. Jearld Ellis, ENT.   BRIEF HISTORY AND PHYSICAL:  Please refer to dictated history and physical  by Dr. Sherrie Mustache. In short, however, this is a 35 year old [place]  female with no significant past medical history except for some allergies.  She started having right earache pain and was treated as an outpatient. The  patient came in secondary to failing outpatient therapy. She was seen in the  ED. At the time afebrile. She had some right ear discharge, draining  yellowish-green pus. Also surrounding excoriations and mild erythema. Very  tender around the right ear and over the mastoid joint. She was subsequently  admitted for management of right ear pain secondary to suppurative otitis  media. Further labs at the time also showed that her potassium was 3.3,  otherwise her labs were okay.   HOSPITAL COURSE:  Problem 1.  Acute suppurative otitis externi. The patient  was initiated on IV Zosyn. She also had ENT consult. Dr. Jearld Ellis saw patient  and Ciprodex was also added. The patient seemed to have done well on this  antibiotic. At this point has improved tremendously. She continued to have  some slight fever up until May 20, 2005. She was subsequently told to  continue antibiotic for a total of 7-10 days as an outpatient. Her Zosyn is  now being switched to Avelox at the time of discharge.   Problem 2.  Hypokalemia. This is transient and no clear cause was found. Her  magnesium was 1.9. TSH was normal at 0.526. No nausea, vomiting or diarrhea.  The patient received potassium and that was repleted.   Problem 3.  Normocytic anemia. She had mild normocytic anemia with  hemoglobin 11.8. She was started on some multivitamins. The patient is at a  young age of menstruating and that may be responsible for that finding.   The patient will follow up with Dr. Jearld Ellis and Dr. Parke Ellis.  [name], M.D.  Electronically Signed     LG/MEDQ  D:  05/23/2005  T:  05/23/2005  Job:  [id number]   cc:   [name], M.D.  Fax: [phone]

## 2011-02-01 ENCOUNTER — Ambulatory Visit: Payer: Medicaid Other | Admitting: Internal Medicine

## 2011-02-08 ENCOUNTER — Ambulatory Visit: Payer: Medicaid Other | Admitting: Internal Medicine

## 2011-02-09 ENCOUNTER — Ambulatory Visit (INDEPENDENT_AMBULATORY_CARE_PROVIDER_SITE_OTHER): Payer: Medicaid Other | Admitting: Internal Medicine

## 2011-02-09 ENCOUNTER — Encounter: Payer: Self-pay | Admitting: Internal Medicine

## 2011-02-09 VITALS — BP 129/81 | HR 71 | Temp 97.5°F | Ht 64.0 in | Wt 144.7 lb

## 2011-02-09 DIAGNOSIS — N898 Other specified noninflammatory disorders of vagina: Secondary | ICD-10-CM

## 2011-02-09 DIAGNOSIS — L2381 Allergic contact dermatitis due to animal (cat) (dog) dander: Secondary | ICD-10-CM

## 2011-02-09 DIAGNOSIS — M79609 Pain in unspecified limb: Secondary | ICD-10-CM

## 2011-02-09 MED ORDER — METRONIDAZOLE 500 MG PO TABS: 500.0000 mg | ORAL_TABLET | Freq: Two times a day (BID) | ORAL | Status: DC

## 2011-02-09 MED ORDER — CLOBETASOL PROPIONATE 0.05 % EX CREA: TOPICAL_CREAM | Freq: Every day | CUTANEOUS | Status: DC

## 2011-02-09 MED ORDER — HYDROCODONE-ACETAMINOPHEN 5-500 MG PO TABS: 1.0000 | ORAL_TABLET | Freq: Three times a day (TID) | ORAL | Status: DC | PRN

## 2011-02-09 MED ORDER — METRONIDAZOLE 500 MG PO TABS: ORAL_TABLET | ORAL | Status: DC

## 2011-02-09 NOTE — Assessment & Plan Note (Signed)
According to the patient the skin lesions are appearing at multiple sites and respond to the steroid cream prescribed to her in the past. This has been evaluated by dermatologist. Provide refill on the steroid cream

## 2011-02-09 NOTE — Assessment & Plan Note (Signed)
Patient reports that she had surgery in the hand to relieve pressure on the nerves. She continues to have symptom of pain tingling and numbness in the affected area on the left arm. She has contacted neurosurgery who has told her that there is nothing more that can be done for her. I will provide 20 Vicodin per month on when necessary basis. it has refill for 3 months.

## 2011-02-09 NOTE — Progress Notes (Signed)
  Subjective:    Patient ID: [name], female    DOB: [birth year], 35 y.o.   MRN: [id number]  HPI [name] is a 35 year old female with past medical history sexually transmitted diseases, chronic pain in left hand from carpal tunnel syndrome now status post surgery depression. She is here with following complaints #1 she was seen in the clinic 3 months ago and given treatment for Chlamydia patient feels that her treatment was incomplete and she continues to have symptoms of vaginal discharge. She says that she is minimally changed. She has multiple sexual partners but does not think any of them is infected with sexual transmitted diseases.#2 she was refill on her pain medications #3 she was refill on her Steroid  Cream which was prescribed for treatment of eczema and the skin lesion has been evaluated by dermatologist in the past   Review of Systems  Constitutional: Negative for fever, chills, activity change and appetite change.  HENT: Negative for nosebleeds, facial swelling, neck pain and tinnitus.   Eyes: Negative for pain, discharge and visual disturbance.  Respiratory: Negative for cough, chest tightness and shortness of breath.   Cardiovascular: Negative for chest pain and palpitations.  Gastrointestinal: Negative for nausea, vomiting, abdominal pain, blood in stool and abdominal distention.  Genitourinary: Positive for vaginal discharge.  Skin: Negative for rash.  Neurological: Negative for dizziness, seizures, weakness and headaches.  Psychiatric/Behavioral: Negative for suicidal ideas, confusion and agitation.       Objective:   Physical Exam  Constitutional: She is oriented to person, place, and time. She appears well-developed and well-nourished.  HENT:  Head: Normocephalic and atraumatic.  Right Ear: External ear normal.  Left Ear: External ear normal.  Eyes: Conjunctivae and EOM are normal. Pupils are equal, round, and reactive to light.  Neck: No JVD present. No  tracheal deviation present. No thyromegaly present.  Cardiovascular: Normal rate, regular rhythm and normal heart sounds.  Exam reveals no gallop.   No murmur heard. Pulmonary/Chest: No respiratory distress. She has no wheezes. She has no rales. She exhibits no tenderness.  Abdominal: Soft. Bowel sounds are normal. She exhibits no distension and no mass. There is no tenderness. There is no rebound and no guarding.  Musculoskeletal: Normal range of motion. She exhibits no edema and no tenderness.  Lymphadenopathy:    She has no cervical adenopathy.  Neurological: She is alert and oriented to person, place, and time. She has normal reflexes. No cranial nerve deficit. Coordination normal.  Skin: No rash noted. No erythema.       Skin lesion of eczematous thickened skin about 5 cm circular above the ankle  Psychiatric: She has a normal mood and affect. Her behavior is normal. Thought content normal.          Assessment & Plan:

## 2011-02-09 NOTE — Patient Instructions (Signed)
Return as needed

## 2011-02-09 NOTE — Assessment & Plan Note (Signed)
Her repeat GC screen in the urine. Symptoms are minimal but patient has not been able to complete her therapy with metronidazole due to possible side effects of nausea. I'll give her her 2 g of metronidazole x1. Repeat pelvic exam and if the symptoms persist.

## 2011-05-13 LAB — WET PREP, GENITAL: Yeast Wet Prep HPF POC: NONE SEEN

## 2011-05-13 LAB — POCT URINALYSIS DIP (DEVICE)
Bilirubin Urine: NEGATIVE
Glucose, UA: NEGATIVE
Ketones, ur: NEGATIVE
Protein, ur: NEGATIVE
Specific Gravity, Urine: 1.02
pH: 6.5

## 2011-05-13 LAB — POCT PREGNANCY, URINE
Operator id: 235561
Preg Test, Ur: NEGATIVE

## 2011-05-13 LAB — GC/CHLAMYDIA PROBE AMP, GENITAL: GC Probe Amp, Genital: NEGATIVE

## 2011-05-13 LAB — URINE CULTURE: Colony Count: NO GROWTH

## 2011-05-27 LAB — GC/CHLAMYDIA PROBE AMP, GENITAL: GC Probe Amp, Genital: NEGATIVE

## 2011-05-27 LAB — WET PREP, GENITAL
Trich, Wet Prep: NONE SEEN
Yeast Wet Prep HPF POC: NONE SEEN

## 2011-06-08 LAB — POCT URINALYSIS DIP (DEVICE)
Glucose, UA: NEGATIVE
Nitrite: NEGATIVE
Operator id: 235561
Protein, ur: 100 — AB
Specific Gravity, Urine: 1.025
Urobilinogen, UA: 1
pH: 7

## 2011-06-08 LAB — WET PREP, GENITAL
Trich, Wet Prep: NONE SEEN
Yeast Wet Prep HPF POC: NONE SEEN

## 2011-06-08 LAB — POCT PREGNANCY, URINE: Operator id: 282151

## 2011-07-09 ENCOUNTER — Encounter (HOSPITAL_COMMUNITY): Payer: Self-pay | Admitting: Emergency Medicine

## 2011-07-09 ENCOUNTER — Emergency Department (HOSPITAL_COMMUNITY)
Admission: EM | Admit: 2011-07-09 | Discharge: 2011-07-09 | Disposition: A | Discharge status: Home / Self Care | Payer: Medicaid Other | Attending: Emergency Medicine | Admitting: Emergency Medicine

## 2011-07-09 ENCOUNTER — Emergency Department (HOSPITAL_COMMUNITY): Payer: Medicaid Other

## 2011-07-09 DIAGNOSIS — M25539 Pain in unspecified wrist: Secondary | ICD-10-CM | POA: Insufficient documentation

## 2011-07-09 DIAGNOSIS — M25532 Pain in left wrist: Secondary | ICD-10-CM

## 2011-07-09 DIAGNOSIS — Z79899 Other long term (current) drug therapy: Secondary | ICD-10-CM | POA: Insufficient documentation

## 2011-07-09 LAB — POCT PREGNANCY, URINE: Preg Test, Ur: NEGATIVE

## 2011-07-09 MED ORDER — NAPROXEN 375 MG PO TABS: 375.0000 mg | ORAL_TABLET | Freq: Two times a day (BID) | ORAL | Status: DC

## 2011-07-09 MED ORDER — IBUPROFEN 800 MG PO TABS
800.0000 mg | ORAL_TABLET | Freq: Once | ORAL | Status: AC
  Administered 2011-07-09: 800 mg via ORAL
  Filled 2011-07-09: qty 1

## 2011-07-09 NOTE — ED Notes (Signed)
MD at bedside. 

## 2011-07-09 NOTE — ED Provider Notes (Signed)
History     CSN: [id number] Arrival date & time: 07/09/2011  2:28 AM   First MD Initiated Contact with Patient 07/09/11 [phone]      Chief Complaint  Patient presents with  . Wrist Pain    (Consider location/radiation/quality/duration/timing/severity/associated sxs/prior treatment) Patient is a 36 y.o. female presenting with wrist pain. The history is provided by the patient. No language interpreter was used.  Wrist Pain This is a chronic problem. The current episode started more than 1 week ago (worse in the past 18 hours now a 10/10). The problem occurs constantly. The problem has been gradually worsening. Pertinent negatives include no chest pain, no abdominal pain, no headaches and no shortness of breath. The symptoms are aggravated by nothing. The symptoms are relieved by nothing. She has tried acetaminophen for the symptoms. The treatment provided no relief.  No trauma.  Pain is on the volar aspect in the middle of the wrist.  No pain medially or laterally.  No f/c/r.  No swelling.  No casting.  No immobilization.  Would also like a refill on her vicodin that she takes for chronic pain.    Past Medical History  Diagnosis Date  . STD (female)   . Dyshidrotic eczema   . Gonococcal cervicitis   . Depression   . Gardnerella vaginitis   . Acne vulgaris     Past Surgical History  Procedure Date  . Tubal ligation 06/2004    bilateral    Family History  Problem Relation Age of Onset  . Stroke Mother     age 53 when she had the stroke  . Hyperlipidemia Father     History  Substance Use Topics  . Smoking status: Never Smoker   . Smokeless tobacco: Not on file  . Alcohol Use: No    OB History    Grav Para Term Preterm Abortions TAB SAB Ect Mult Living                  Review of Systems  Constitutional: Negative for activity change.  HENT: Negative for facial swelling.   Eyes: Negative for discharge.  Respiratory: Negative for shortness of breath.   Cardiovascular:  Negative for chest pain.  Gastrointestinal: Negative for abdominal pain and abdominal distention.  Genitourinary: Negative for difficulty urinating.  Musculoskeletal: Negative for back pain.  Neurological: Negative for headaches.  Hematological: Negative.   Psychiatric/Behavioral: Negative.     Allergies  Review of patient's allergies indicates no known allergies.  Home Medications   Current Outpatient Rx  Name Route Sig Dispense Refill  . CITALOPRAM HYDROBROMIDE 20 MG PO TABS Oral Take 20 mg by mouth daily.      Marland Kitchen CLOBETASOL PROPIONATE 0.05 % EX CREA Topical Apply topically daily. 30 g 3  . HYDROCODONE-ACETAMINOPHEN 5-500 MG PO TABS Oral Take 1 tablet by mouth every 8 (eight) hours as needed. 20 tablet 3  . IBUPROFEN 400 MG PO TABS Oral Take 400 mg by mouth every 8 (eight) hours as needed.      Marland Kitchen METRONIDAZOLE 500 MG PO TABS  Take four tablets at once and then stop. 4 tablet 0  . NAPROXEN 375 MG PO TABS Oral Take 1 tablet (375 mg total) by mouth 2 (two) times daily. 20 tablet 0  . OMEPRAZOLE 20 MG PO CPDR Oral Take 20 mg by mouth daily.        BP 120/79  Pulse 70  Temp(Src) 97 F (36.1 C) (Oral)  Resp 18  Ht 5'  4" (1.626 m)  Wt 142 lb (64.411 kg)  BMI 24.37 kg/m2  SpO2 98%  LMP 07/06/2011  Physical Exam  Constitutional: She appears well-developed and well-nourished. No distress.  HENT:  Head: Normocephalic and atraumatic.  Eyes: Conjunctivae and EOM are normal. Right eye exhibits no discharge. Left eye exhibits no discharge.  Neck: Normal range of motion. Neck supple.  Cardiovascular: Normal rate and regular rhythm.   Pulmonary/Chest: Effort normal and breath sounds normal.  Abdominal: Soft. Bowel sounds are normal.  Musculoskeletal: Normal range of motion. She exhibits no edema and no tenderness.       No snuff box tenderness of the left wrist. intact pronation and supination of the left forearm.  Intact flexion and extension.  2+ radial pulse.  5/5 strength.  Intact  sensation and motor to all nerve distributions dorsally and palmar to the left hand cap refill to the fingers < 2 sec.  No deformity  Neurological: She has normal reflexes.  Skin: Skin is warm and dry.  Psychiatric: Thought content normal.    ED Course  Procedures (including critical care time)   Labs Reviewed  POCT PREGNANCY, URINE  POCT PREGNANCY, URINE   Dg Wrist Complete Left  07/09/2011  *RADIOLOGY REPORT*  Clinical Data: Left wrist pain  LEFT WRIST - COMPLETE 3+ VIEW  Comparison: 01/12/2006 radiograph  Findings: Focal area of sclerosis along the radial aspect of the scaphoid, similar to prior.  No acute fracture or dislocation identified.  No aggressive osseous abnormality.  IMPRESSION: No acute osseous abnormality. If clinical concern for a fracture persists, recommend a repeat radiograph in 5-10 days to evaluate for interval change or callus formation.  Original Report Authenticated By: Waneta Martins, M.D.     1. Wrist pain, left       MDM  Follow up with your family doctor for refills on your medications.  Take medication as directed.  Call your family doctor on Monday for worsening symptoms.  Patient verbalizes understanding and agrees to follow up        Kambre Messner Smitty Cords, MD 07/09/11 0500

## 2011-07-09 NOTE — ED Notes (Signed)
Patient being discharged. D/C Instructions explained to patient, verbalizes understanding,  further questions addressed Patient provided with prescriptions for Naprosyn explained. Patient still reports 10/10 left wrist pain, Dr. Nicanor Alcon aware, no further orders here, prescription for naprosyn called in to her pharmacy. Patient escorted to discharge window with all personal belongings.

## 2011-07-09 NOTE — ED Notes (Signed)
Patient reports left wrist pain since April 2003.  States around 1200 yesterday with increased pain 10/10,  Patient took tylenol 1000mg  at 1700 with no relief.  States that it feels like it is out of socket or that she has a blood clot.  Numbess and tingling as well.

## 2011-07-12 ENCOUNTER — Ambulatory Visit (INDEPENDENT_AMBULATORY_CARE_PROVIDER_SITE_OTHER): Payer: Medicaid Other | Admitting: Internal Medicine

## 2011-07-12 ENCOUNTER — Encounter: Payer: Self-pay | Admitting: Internal Medicine

## 2011-07-12 VITALS — BP 117/77 | HR 93 | Temp 97.7°F | Ht 64.0 in | Wt 145.4 lb

## 2011-07-12 DIAGNOSIS — M25532 Pain in left wrist: Secondary | ICD-10-CM

## 2011-07-12 DIAGNOSIS — M25539 Pain in unspecified wrist: Secondary | ICD-10-CM

## 2011-07-12 DIAGNOSIS — M79609 Pain in unspecified limb: Secondary | ICD-10-CM

## 2011-07-12 MED ORDER — HYDROCODONE-ACETAMINOPHEN 5-500 MG PO TABS: 1.0000 | ORAL_TABLET | Freq: Three times a day (TID) | ORAL | Status: DC | PRN

## 2011-07-12 NOTE — Patient Instructions (Signed)
Pain Medicine Instructions You have been given a prescription for pain medicines. These medicines may affect your ability to think clearly. They may also affect your ability to perform physical activities. Take these medicines only as needed for pain. You do not need to take them if you are not having pain, unless directed by your caregiver. You can take less than the prescribed dose if you find a smaller amount of medicine controls the pain. It may not be possible to make all of your pain go away, but you should be comfortable enough to move, breathe, and take care of yourself. After you start taking pain medicines, while taking the medicines, and for 8 hours after stopping the medicines:  Do not drive.   Do not operate machinery.   Do not operate power tools.   Do not sign legal documents.   Do not supervise children by yourself.   Do not participate in activities that require climbing or being in high places.   Do not enter a body of water (lake, river, ocean, spa, swimming pool) without an adult nearby who can help you.  You may have been prescribed a pain medicine that contains acetaminophen (paracetamol). If so, take only the amount directed by your caregiver. Do not take any other acetaminophen while taking this medicine. An overdose of acetaminophen can result in severe liver damage. If you are taking other medicines, check the active ingredients for acetaminophen. Acetaminophen is found in hundreds of over-the-counter and prescription medicines. These include cold relief products, menstrual cramp relief medicines, fever-reducing medicines, acid indigestion relief products, and pain relief products. HOME CARE INSTRUCTIONS   Do not drink alcohol, take sleeping pills, or take other medicines until at least 8 hours after your last dose of pain medicine, or as directed by your caregiver.   Use a bulk stool softener if you become constipated from your pain medicines. Increasing your intake  of fruits and vegetables will also help.   Write down the times when you take your medicines. Look at the times before taking your next dose of medicine. It is easy to become confused while on pain medicines. Recording the times helps you to avoid an overdose.  SEEK MEDICAL CARE IF:  Your medicine is not helping the pain go away.   You vomit or have diarrhea shortly after taking the medicine.   You develop new pain in areas that did not hurt before.  SEEK IMMEDIATE MEDICAL CARE IF:  You feel dizzy or faint.   You feel there are other problems that might be caused by your medicine.  MAKE SURE YOU:   Understand these instructions.   Will watch your condition.   Will get help right away if you are not doing well or get worse.  Document Released: 11/14/2000 Document Revised: 04/20/2011 Document Reviewed: 07/23/2010 [employer] Patient Information 2012 [employer]. 
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
## 2011-07-12 NOTE — Assessment & Plan Note (Signed)
Patient has had pain for several years now and is disabled because of it. There is some malingering involved as patient was hypersensitive to touch which was a little over exaggeration of something which has been present for years. The nurse noted that she had put in the wrist splint just prior to her evaluation which is again concerning.  She does have a legitimate disease but the extent cannot be assessed objectively. -May benefit from pain medicine and rehab consult as they also perform NCV studies as part of evaluation. -Will give 20 tabs of vicodin with 3 refills to be used in case of extreme pain only. -Continue to wear wrist splint.

## 2011-07-12 NOTE — Progress Notes (Signed)
Vicodin 5/500mg  rx called to [facility] on [address].

## 2011-07-21 ENCOUNTER — Telehealth: Payer: Self-pay | Admitting: *Deleted

## 2011-07-21 NOTE — Telephone Encounter (Signed)
Ok, I will see her tomorrow.  Thank you, Chadd Tollison

## 2011-07-21 NOTE — Telephone Encounter (Signed)
Pt calls c/o L ear pain and fullness x 1 week, also sore throat "feels like something is draining into my throat". States she had same problem several years ago and was hospitalized, does not want to do that again. appt per sharonb. 11/30 at 1045

## 2011-07-22 ENCOUNTER — Ambulatory Visit (INDEPENDENT_AMBULATORY_CARE_PROVIDER_SITE_OTHER): Payer: Medicaid Other | Admitting: Internal Medicine

## 2011-07-22 ENCOUNTER — Encounter: Payer: Self-pay | Admitting: Internal Medicine

## 2011-07-22 DIAGNOSIS — H9202 Otalgia, left ear: Secondary | ICD-10-CM

## 2011-07-22 DIAGNOSIS — H9209 Otalgia, unspecified ear: Secondary | ICD-10-CM

## 2011-07-22 NOTE — Patient Instructions (Signed)

## 2011-07-22 NOTE — Progress Notes (Signed)
  Subjective:    Patient ID: [name], female    DOB: 1975/10/30, 36 y.o.   MRN: [id number]  HPI  Patient is here today complaining of left ear pain. She said that the pain started about 2 days ago, it was 5/10 at its worse, radiating to her left side of her jaw, worse with picking up the ear with Q-tips, better with pain medications, associated with discharge which was waxy and dark brown in color. No fever chills or postnasal drip.  Review of Systems  Constitutional: Negative for fever, activity change and appetite change.  HENT: Negative for sore throat.   Respiratory: Negative for cough and shortness of breath.   Cardiovascular: Negative for chest pain and leg swelling.  Gastrointestinal: Negative for nausea, abdominal pain, diarrhea, constipation and abdominal distention.  Genitourinary: Negative for frequency, hematuria and difficulty urinating.  Neurological: Negative for dizziness and headaches.  Psychiatric/Behavioral: Negative for suicidal ideas and behavioral problems.       Objective:   Physical Exam  Constitutional: She is oriented to person, place, and time. She appears well-developed and well-nourished.  HENT:  Head: Normocephalic and atraumatic.  Right Ear: External ear normal.  Left Ear: Hearing, tympanic membrane, external ear and ear canal normal. No lacerations. No drainage, swelling or tenderness. No foreign bodies. Tympanic membrane is not injected, not scarred, not perforated, not erythematous, not retracted and not bulging.  No middle ear effusion. No hemotympanum. No decreased hearing is noted.  Ears:  Eyes: Conjunctivae and EOM are normal. Pupils are equal, round, and reactive to light. No scleral icterus.  Neck: Normal range of motion. Neck supple. No JVD present. No thyromegaly present.  Cardiovascular: Normal rate, regular rhythm, normal heart sounds and intact distal pulses.  Exam reveals no gallop and no friction rub.   No murmur  heard. Pulmonary/Chest: Effort normal and breath sounds normal. No respiratory distress. She has no wheezes. She has no rales.  Abdominal: Soft. Bowel sounds are normal. She exhibits no distension and no mass. There is no tenderness. There is no rebound and no guarding.  Musculoskeletal: Normal range of motion. She exhibits no edema and no tenderness.  Lymphadenopathy:    She has no cervical adenopathy.  Neurological: She is alert and oriented to person, place, and time.  Psychiatric: She has a normal mood and affect. Her behavior is normal.          Assessment & Plan:

## 2011-07-22 NOTE — Assessment & Plan Note (Signed)
Patient does not have any findings related to inflammation or infection of her left ear.  I advised her not to put Q-tips in ear. Advice her to take ibuprofen as needed for pain. Advised her to steam inhalation 3 times a day for next 1 week. Since it is already resolving by itself, patient was told to contact our clinic again if it gets worse.

## 2011-07-25 ENCOUNTER — Encounter: Payer: Medicaid Other | Admitting: Internal Medicine

## 2012-01-31 ENCOUNTER — Other Ambulatory Visit: Payer: Self-pay | Admitting: Internal Medicine

## 2012-01-31 DIAGNOSIS — L2381 Allergic contact dermatitis due to animal (cat) (dog) dander: Secondary | ICD-10-CM

## 2012-02-02 NOTE — Telephone Encounter (Signed)
Please, have Dr. Eben Burow address this refill request.

## 2012-02-17 NOTE — Telephone Encounter (Signed)
Left message on home phone ID recording - needs appt in clinic for refill on Vicodin 5-500mg   And clobetasol cream per Dr Eben Burow. Last seen in clinic 06/2011. Stanton Kidney Roshanna Cimino RN 02/17/12 4:45PM

## 2012-02-21 MED ORDER — CLOBETASOL PROPIONATE 0.05 % EX CREA: TOPICAL_CREAM | Freq: Every day | CUTANEOUS | Status: DC

## 2012-02-22 NOTE — Telephone Encounter (Signed)
Called hydrocodone to [facility]

## 2012-03-28 ENCOUNTER — Other Ambulatory Visit (HOSPITAL_COMMUNITY)
Admission: RE | Admit: 2012-03-28 | Discharge: 2012-03-28 | Disposition: A | Discharge status: Home / Self Care | Payer: Medicaid Other | Source: Ambulatory Visit | Attending: Internal Medicine | Admitting: Internal Medicine

## 2012-03-28 ENCOUNTER — Encounter: Payer: Self-pay | Admitting: Internal Medicine

## 2012-03-28 ENCOUNTER — Ambulatory Visit (INDEPENDENT_AMBULATORY_CARE_PROVIDER_SITE_OTHER): Payer: Medicaid Other | Admitting: Internal Medicine

## 2012-03-28 VITALS — BP 135/79 | HR 70 | Temp 98.9°F | Ht 64.0 in | Wt 141.5 lb

## 2012-03-28 DIAGNOSIS — N898 Other specified noninflammatory disorders of vagina: Secondary | ICD-10-CM

## 2012-03-28 DIAGNOSIS — N760 Acute vaginitis: Secondary | ICD-10-CM | POA: Insufficient documentation

## 2012-03-28 DIAGNOSIS — N949 Unspecified condition associated with female genital organs and menstrual cycle: Secondary | ICD-10-CM

## 2012-03-28 DIAGNOSIS — Z01419 Encounter for gynecological examination (general) (routine) without abnormal findings: Secondary | ICD-10-CM | POA: Insufficient documentation

## 2012-03-28 DIAGNOSIS — Z113 Encounter for screening for infections with a predominantly sexual mode of transmission: Secondary | ICD-10-CM | POA: Insufficient documentation

## 2012-03-28 NOTE — Patient Instructions (Addendum)
1. Follow up in 6 month 2. Will call u for any positive test. 3. Patient would like to information on bacteria vaginosis Bacterial Vaginosis Bacterial vaginosis (BV) is a vaginal infection where the normal balance of bacteria in the vagina is disrupted. The normal balance is then replaced by an overgrowth of certain bacteria. There are several different kinds of bacteria that can cause BV. BV is the most common vaginal infection in women of childbearing age. CAUSES   The cause of BV is not fully understood. BV develops when there is an increase or imbalance of harmful bacteria.   Some activities or behaviors can upset the normal balance of bacteria in the vagina and put women at increased risk including:   Having a new sex partner or multiple sex partners.   Douching.   Using an intrauterine device (IUD) for contraception.   It is not clear what role sexual activity plays in the development of BV. However, women that have never had sexual intercourse are rarely infected with BV.  Women do not get BV from toilet seats, bedding, swimming pools or from touching objects around them.  SYMPTOMS   Grey vaginal discharge.   A fish-like odor with discharge, especially after sexual intercourse.   Itching or burning of the vagina and vulva.   Burning or pain with urination.   Some women have no signs or symptoms at all.  DIAGNOSIS  Your caregiver must examine the vagina for signs of BV. Your caregiver will perform lab tests and look at the sample of vaginal fluid through a microscope. They will look for bacteria and abnormal cells (clue cells), a pH test higher than 4.5, and a positive amine test all associated with BV.  RISKS AND COMPLICATIONS   Pelvic inflammatory disease (PID).   Infections following gynecology surgery.   Developing HIV.   Developing herpes virus.  TREATMENT  Sometimes BV will clear up without treatment. However, all women with symptoms of BV should be treated to  avoid complications, especially if gynecology surgery is planned. Female partners generally do not need to be treated. However, BV may spread between female sex partners so treatment is helpful in preventing a recurrence of BV.   BV may be treated with antibiotics. The antibiotics come in either pill or vaginal cream forms. Either can be used with nonpregnant or pregnant women, but the recommended dosages differ. These antibiotics are not harmful to the baby.   BV can recur after treatment. If this happens, a second round of antibiotics will often be prescribed.   Treatment is important for pregnant women. If not treated, BV can cause a premature delivery, especially for a pregnant woman who had a premature birth in the past. All pregnant women who have symptoms of BV should be checked and treated.   For chronic reoccurrence of BV, treatment with a type of prescribed gel vaginally twice a week is helpful.  HOME CARE INSTRUCTIONS   Finish all medication as directed by your caregiver.   Do not have sex until treatment is completed.   Tell your sexual partner that you have a vaginal infection. They should see their caregiver and be treated if they have problems, such as a mild rash or itching.   Practice safe sex. Use condoms. Only have 1 sex partner.  PREVENTION  Basic prevention steps can help reduce the risk of upsetting the natural balance of bacteria in the vagina and developing BV:  Do not have sexual intercourse (be abstinent).  Do not douche.   Use all of the medicine prescribed for treatment of BV, even if the signs and symptoms go away.   Tell your sex partner if you have BV. That way, they can be treated, if needed, to prevent reoccurrence.  SEEK MEDICAL CARE IF:   Your symptoms are not improving after 3 days of treatment.   You have increased discharge, pain, or fever.  MAKE SURE YOU:   Understand these instructions.   Will watch your condition.   Will get help right  away if you are not doing well or get worse.  FOR MORE INFORMATION  Division of STD Prevention (DSTDP), Centers for Disease Control and Prevention: [employer] American Social Health Association (ASHA): www.ashastd.org  Document Released: 08/08/2005 Document Revised: 07/28/2011 Document Reviewed: 01/29/2009 [facility] Patient Information 2012 [place], [place].

## 2012-03-28 NOTE — Assessment & Plan Note (Signed)
One month Vaginal odor with mild yellowish vaginal discharge. Denies dysuria, urinary frequency or urgency.  Denies fever or chills  - will perform Pap smear, wet mount, GC and chlamydia - HIV and syphilis  ( patient would like to check all of her STDs)

## 2012-03-28 NOTE — Progress Notes (Signed)
  Subjective:   Patient ID: [name] female   DOB: 04-Jun-1976 36 y.o.   MRN: [id number]  HPI: [name] is a 36 y.o. woman with PMH of Gonococcal cervicitis, gardnerella vaginitis and depression who presents to the clinic for follow up visit. Patient reports vaginal odor with mild yellowish discharge for one month. She only has one long term sexual partner in her life. Denies any high risk sexual activity. Denies dysuria, urinary frequency or urgency. Denies fever, chills or  Night sweats. Denies nausea, vomiting and diarrhea.  She would like have STD checked. She is due for her PAP smear.    Past Medical History  Diagnosis Date  . STD (female)   . Dyshidrotic eczema   . Gonococcal cervicitis   . Depression   . Gardnerella vaginitis   . Acne vulgaris    Current Outpatient Prescriptions  Medication Sig Dispense Refill  . clobetasol cream (TEMOVATE) 0.05 % Apply topically daily.  30 g  3  . HYDROcodone-acetaminophen (VICODIN) 5-500 MG per tablet TAKE 1 TABLET BY MOUTH EVERY 8 HOURS AS NEEDED  20 tablet  0   Family History  Problem Relation Age of Onset  . Stroke Mother     age 64 when she had the stroke  . Hyperlipidemia Father    History   Social History  . Marital Status: Single    Spouse Name: N/A    Number of Children: N/A  . Years of Education: N/A   Social History Main Topics  . Smoking status: Never Smoker   . Smokeless tobacco: None  . Alcohol Use: No  . Drug Use: None  . Sexually Active: None   Other Topics Concern  . None   Social History Narrative   Has 2 children.Father's children passed away from ruptured aneurysm in 2005.Works at [employer].   Review of Systems: See HPI  Objective:  Physical Exam: Filed Vitals:   03/28/12 1453  BP: 135/79  Pulse: 70  Temp: 98.9 F (37.2 C)  TempSrc: Oral  Height: 5\' 4"  (1.626 m)  Weight: 141 lb 8 oz (64.184 kg)  SpO2: 99%  General: alert, well-developed, and cooperative to examination.    Head: normocephalic and atraumatic.  Eyes: vision grossly intact, pupils equal, pupils round, pupils reactive to light, no injection and anicteric.  Mouth: pharynx pink and moist, no erythema, and no exudates.  Neck: supple, full ROM, no thyromegaly, no JVD, and no carotid bruits.  Lungs: normal respiratory effort, no accessory muscle use, normal breath sounds, no crackles, and no wheezes. Heart: normal rate, regular rhythm, no murmur, no gallop, and no rub.  Abdomen: soft, non-tender, normal bowel sounds, no distention, no guarding, no rebound tenderness, no hepatomegaly, and no splenomegaly.  Msk: no joint swelling, no joint warmth, and no redness over joints.  Pulses: 2+ DP/PT pulses bilaterally Extremities: No cyanosis, clubbing, edema Neurologic: alert & oriented X3, cranial nerves II-XII intact, strength normal in all extremities, sensation intact to light touch, and gait normal.  Skin: turgor normal and no rashes.  Psych: Oriented X3, memory intact for recent and remote, normally interactive, good eye contact, not anxious appearing, and not depressed appearing.  GU: normal external genitalia, normal-appearing vaginal vault, and endo & exocervical Pap obtained, mild whitish discharge noted. Normal appearing cervix.  Assessment & Plan:

## 2012-03-29 LAB — HIV ANTIBODY (ROUTINE TESTING W REFLEX): HIV: NONREACTIVE

## 2012-03-30 ENCOUNTER — Telehealth: Payer: Self-pay | Admitting: Internal Medicine

## 2012-03-30 MED ORDER — METRONIDAZOLE 500 MG PO TABS: 500.0000 mg | ORAL_TABLET | Freq: Two times a day (BID) | ORAL | Status: AC

## 2012-03-30 NOTE — Telephone Encounter (Signed)
Talked with pt - pt states is not a good pill taker - usually  Does not take full Rx. To call if any problems.

## 2012-03-30 NOTE — Telephone Encounter (Signed)
Flagyl 500 mg po BID sent to her Pharmacy for bacteria vaginosis. Called patient and left message for her to call back to talk to [place].

## 2012-04-21 ENCOUNTER — Emergency Department (HOSPITAL_COMMUNITY)
Admission: EM | Admit: 2012-04-21 | Discharge: 2012-04-21 | Disposition: A | Discharge status: Home / Self Care | Payer: Medicaid Other | Attending: Emergency Medicine | Admitting: Emergency Medicine

## 2012-04-21 ENCOUNTER — Encounter (HOSPITAL_COMMUNITY): Payer: Self-pay | Admitting: Emergency Medicine

## 2012-04-21 ENCOUNTER — Emergency Department (HOSPITAL_COMMUNITY): Payer: Medicaid Other

## 2012-04-21 DIAGNOSIS — IMO0002 Reserved for concepts with insufficient information to code with codable children: Secondary | ICD-10-CM | POA: Insufficient documentation

## 2012-04-21 DIAGNOSIS — R0602 Shortness of breath: Secondary | ICD-10-CM | POA: Insufficient documentation

## 2012-04-21 DIAGNOSIS — F419 Anxiety disorder, unspecified: Secondary | ICD-10-CM

## 2012-04-21 DIAGNOSIS — R51 Headache: Secondary | ICD-10-CM | POA: Insufficient documentation

## 2012-04-21 DIAGNOSIS — F411 Generalized anxiety disorder: Secondary | ICD-10-CM | POA: Insufficient documentation

## 2012-04-21 DIAGNOSIS — R1013 Epigastric pain: Secondary | ICD-10-CM | POA: Insufficient documentation

## 2012-04-21 LAB — ACETAMINOPHEN LEVEL: Acetaminophen (Tylenol), Serum: <15 ug/mL (ref 10–30)

## 2012-04-21 LAB — CBC WITH DIFFERENTIAL/PLATELET
Eosinophils Absolute: 0.1 10*3/uL (ref 0.0–0.7)
Hemoglobin: 12.7 g/dL (ref 12.0–15.0)
Lymphocytes Relative: 38 % (ref 12–46)
Lymphs Abs: 1.6 10*3/uL (ref 0.7–4.0)
MCH: 30 pg (ref 26.0–34.0)
Monocytes Relative: 6 % (ref 3–12)
Neutro Abs: 2.4 10*3/uL (ref 1.7–7.7)
Neutrophils Relative %: 55 % (ref 43–77)
RBC: 4.24 MIL/uL (ref 3.87–5.11)
WBC: 4.3 10*3/uL (ref 4.0–10.5)

## 2012-04-21 LAB — URINALYSIS, ROUTINE W REFLEX MICROSCOPIC
Bilirubin Urine: NEGATIVE
Hgb urine dipstick: NEGATIVE
Nitrite: NEGATIVE
Urobilinogen, UA: 1 mg/dL (ref 0.0–1.0)

## 2012-04-21 LAB — COMPREHENSIVE METABOLIC PANEL
ALT: 10 U/L (ref 0–35)
Alkaline Phosphatase: 49 U/L (ref 39–117)
BUN: 11 mg/dL (ref 6–23)
CO2: 23 mEq/L (ref 19–32)
Chloride: 107 mEq/L (ref 96–112)
GFR calc Af Amer: >90 mL/min (ref >90)
GFR calc non Af Amer: >90 mL/min (ref >90)
Glucose, Bld: 87 mg/dL (ref 70–99)
Potassium: 3.7 mEq/L (ref 3.5–5.1)
Total Bilirubin: 0.3 mg/dL (ref 0.3–1.2)

## 2012-04-21 LAB — RAPID URINE DRUG SCREEN, HOSP PERFORMED
Amphetamines: NOT DETECTED
Benzodiazepines: NOT DETECTED
Tetrahydrocannabinol: POSITIVE — AB

## 2012-04-21 LAB — SALICYLATE LEVEL: Salicylate Lvl: <2 mg/dL — ABNORMAL LOW (ref 2.8–20.0)

## 2012-04-21 LAB — LIPASE, BLOOD: Lipase: 31 U/L (ref 11–59)

## 2012-04-21 MED ORDER — LORAZEPAM 1 MG PO TABS
1.0000 mg | ORAL_TABLET | Freq: Once | ORAL | Status: AC
  Administered 2012-04-21: 1 mg via ORAL
  Filled 2012-04-21: qty 1

## 2012-04-21 MED ORDER — ALPRAZOLAM 0.25 MG PO TABS: 0.2500 mg | ORAL_TABLET | Freq: Every evening | ORAL | Status: AC | PRN

## 2012-04-21 MED ORDER — ACETAMINOPHEN 325 MG PO TABS
650.0000 mg | ORAL_TABLET | Freq: Once | ORAL | Status: AC
  Administered 2012-04-21: 650 mg via ORAL
  Filled 2012-04-21: qty 2

## 2012-04-21 MED ORDER — ONDANSETRON 4 MG PO TBDP
4.0000 mg | ORAL_TABLET | Freq: Once | ORAL | Status: AC
  Administered 2012-04-21: 4 mg via ORAL
  Filled 2012-04-21: qty 1

## 2012-04-21 NOTE — ED Notes (Signed)
Asked patient for urine sample and patient said she's "been trying and nothing is happening, I went when I first got here." Will attempt to collect UA in 20 minutes

## 2012-04-21 NOTE — ED Notes (Signed)
Pt c/o headache and abd pain.  MD notified.  New order given for pt.

## 2012-04-21 NOTE — ED Provider Notes (Signed)
History     CSN: [id number]  Arrival date & time 04/21/12  1214   First MD Initiated Contact with Patient 04/21/12 1225      Chief Complaint  Patient presents with  . Headache  . Shortness of Breath  . Abdominal Pain    (Consider location/radiation/quality/duration/timing/severity/associated sxs/prior treatment) HPI Pt admits to being under a lot of stress from "friends" this past week and has a history of bipolar disorder with chronic non-compliance. Pt got upset yesterday and now c/o of multiple symptoms including epigastric pain with nausea, SOB, headache and photophobia, and tremors. No fever, focal weakness, V/C/D, no vaginal or urinary complaints.  Past Medical History  Diagnosis Date  . STD (female)   . Dyshidrotic eczema   . Gonococcal cervicitis   . Depression   . Gardnerella vaginitis   . Acne vulgaris     Past Surgical History  Procedure Date  . Tubal ligation 06/2004    bilateral    Family History  Problem Relation Age of Onset  . Stroke Mother     age 47 when she had the stroke  . Hyperlipidemia Father     History  Substance Use Topics  . Smoking status: Never Smoker   . Smokeless tobacco: Not on file  . Alcohol Use: No    OB History    Grav Para Term Preterm Abortions TAB SAB Ect Mult Living                  Review of Systems  Constitutional: Negative for fever.  HENT: Positive for rhinorrhea. Negative for neck pain and neck stiffness.   Eyes: Positive for photophobia.  Respiratory: Positive for shortness of breath. Negative for cough and wheezing.   Cardiovascular: Negative for chest pain and leg swelling.  Gastrointestinal: Positive for nausea and abdominal pain. Negative for vomiting, diarrhea, constipation and abdominal distention.  Genitourinary: Negative for dysuria, vaginal bleeding and vaginal discharge.  Neurological: Positive for light-headedness and headaches. Negative for weakness and numbness.  Psychiatric/Behavioral: Positive  for agitation. Negative for suicidal ideas, hallucinations and self-injury. The patient is nervous/anxious.     Allergies  Review of patient's allergies indicates no known allergies.  Home Medications   Current Outpatient Rx  Name Route Sig Dispense Refill  . CLOBETASOL PROPIONATE 0.05 % EX CREA Topical Apply 1 application topically daily as needed. For eczema.    Marland Kitchen HYDROCODONE-ACETAMINOPHEN 5-500 MG PO TABS Oral Take 1 tablet by mouth every 8 (eight) hours as needed. For pain.    Marland Kitchen ALPRAZOLAM 0.25 MG PO TABS Oral Take 1 tablet (0.25 mg total) by mouth at bedtime as needed for anxiety. 20 tablet 0    BP 164/85  Pulse 73  Temp 97.9 F (36.6 C) (Oral)  Resp 16  SpO2 100%  LMP 04/11/2012  Physical Exam  Nursing note and vitals reviewed. Constitutional: She is oriented to person, place, and time. She appears well-developed and well-nourished.       Pt is agitated and upset  HENT:  Head: Normocephalic and atraumatic.  Mouth/Throat: Oropharynx is clear and moist.  Eyes: EOM are normal. Pupils are equal, round, and reactive to light.  Neck: Normal range of motion. Neck supple.       No meningismus   Cardiovascular: Normal rate and regular rhythm.   Pulmonary/Chest: Effort normal and breath sounds normal. No respiratory distress. She has no wheezes. She has no rales. She exhibits no tenderness.  Abdominal: Soft. Bowel sounds are normal. She exhibits no  distension and no mass. There is tenderness (very mild epigastric TTP). There is no rebound and no guarding.  Musculoskeletal: Normal range of motion. She exhibits no edema and no tenderness.       No calf swelling or pain  Neurological: She is alert and oriented to person, place, and time.  Skin: Skin is warm and dry. No rash noted. No erythema.  Psychiatric: She has a normal mood and affect. Her behavior is normal.    ED Course  Procedures (including critical care time)  Labs Reviewed  COMPREHENSIVE METABOLIC PANEL - Abnormal;  Notable for the following:    Albumin 3.4 (*)     All other components within normal limits  URINALYSIS, ROUTINE W REFLEX MICROSCOPIC - Abnormal; Notable for the following:    APPearance CLOUDY (*)     Ketones, ur TRACE (*)     All other components within normal limits  URINE RAPID DRUG SCREEN (HOSP PERFORMED) - Abnormal; Notable for the following:    Tetrahydrocannabinol POSITIVE (*)     All other components within normal limits  SALICYLATE LEVEL - Abnormal; Notable for the following:    Salicylate Lvl <2.0 (*)     All other components within normal limits  CBC WITH DIFFERENTIAL  LIPASE, BLOOD  PREGNANCY, URINE  ETHANOL  ACETAMINOPHEN LEVEL   Dg Chest Port 1 View  04/21/2012  *RADIOLOGY REPORT*  Clinical Data: Epigastric pain / discomfort.  Shortness of breath. Smoker.  PORTABLE CHEST - 1 VIEW  Comparison: 02/26/2008  Findings: Numerous leads and wires project over the chest.  Normal heart size.  No pleural effusion or pneumothorax.  Clear lungs.  No free intraperitoneal air.  IMPRESSION: No acute cardiopulmonary disease.   Original Report Authenticated By: Consuello Bossier, M.D.      1. Anxiety      Date: 04/21/2012  Rate:72  Rhythm: normal sinus rhythm  QRS Axis: normal  Intervals: normal  ST/T Wave abnormalities: normal  Conduction Disutrbances:none  Narrative Interpretation:   Old EKG Reviewed: none available    MDM   Pt is calm and symptoms have resolved. Will have ACT give outpatient resources and Rx for benzo to take PRN. Continues to deny SI, HI.        Loren Racer, MD 04/21/12 1515

## 2012-04-25 ENCOUNTER — Encounter: Payer: Self-pay | Admitting: Internal Medicine

## 2012-04-25 ENCOUNTER — Ambulatory Visit (INDEPENDENT_AMBULATORY_CARE_PROVIDER_SITE_OTHER): Payer: Medicaid Other | Admitting: Internal Medicine

## 2012-04-25 VITALS — BP 118/79 | HR 86 | Temp 97.6°F | Ht 64.0 in | Wt 141.5 lb

## 2012-04-25 DIAGNOSIS — M79609 Pain in unspecified limb: Secondary | ICD-10-CM

## 2012-04-25 DIAGNOSIS — L301 Dyshidrosis [pompholyx]: Secondary | ICD-10-CM

## 2012-04-25 DIAGNOSIS — F419 Anxiety disorder, unspecified: Secondary | ICD-10-CM

## 2012-04-25 DIAGNOSIS — F411 Generalized anxiety disorder: Secondary | ICD-10-CM

## 2012-04-25 MED ORDER — HYDROCODONE-ACETAMINOPHEN 5-500 MG PO TABS: 1.0000 | ORAL_TABLET | Freq: Three times a day (TID) | ORAL | Status: DC | PRN

## 2012-04-25 MED ORDER — ZOLPIDEM TARTRATE 5 MG PO TABS: 5.0000 mg | ORAL_TABLET | Freq: Every evening | ORAL | Status: DC | PRN

## 2012-04-25 MED ORDER — CLOBETASOL PROPIONATE 0.05 % EX CREA: 1.0000 "application " | TOPICAL_CREAM | Freq: Every day | CUTANEOUS | Status: DC | PRN

## 2012-04-25 NOTE — Patient Instructions (Signed)
1.  Start Ambien 5 mg tablets.  Take 1 tablet at bedtime to help you rest.  2.  It is okay to use the Xanax during the day when you have panic attacks  3.  Follow up with me next week to see how you are doing.
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
## 2012-05-01 ENCOUNTER — Encounter: Payer: Medicaid Other | Admitting: Internal Medicine

## 2012-07-18 NOTE — Assessment & Plan Note (Signed)
Refilled her pain medication today.

## 2012-07-18 NOTE — Assessment & Plan Note (Signed)
Refilled her topical steroid cream today.

## 2012-07-18 NOTE — Assessment & Plan Note (Signed)
She has pretty significant insomnia that is likely the major contributory factor to her current flare of anxiety.  We will start with working to get a good night of sleep and reevaluate the need for SSRI or counseling.  We will start with Ambien 5 mg qHS prn.  She was told that she can continue to use the Xanax sparingly for daytime panic attacks.  She currently denies SI and HI.  She was advised that if she has any thoughts of hurting herself or other that she should be evaluated emergently.  She was warned to make sure that she doesn't drive for the first few mornings after using the ambien until she knows if she has a hangover effect from its use.

## 2012-11-12 ENCOUNTER — Other Ambulatory Visit: Payer: Self-pay | Admitting: *Deleted

## 2012-11-12 DIAGNOSIS — L301 Dyshidrosis [pompholyx]: Secondary | ICD-10-CM

## 2012-11-12 MED ORDER — CLOBETASOL PROPIONATE 0.05 % EX CREA: 1.0000 "application " | TOPICAL_CREAM | Freq: Every day | CUTANEOUS | Status: DC | PRN

## 2013-01-17 ENCOUNTER — Encounter: Payer: Medicaid Other | Admitting: Internal Medicine

## 2013-02-07 ENCOUNTER — Other Ambulatory Visit: Payer: Self-pay | Admitting: Internal Medicine

## 2013-07-02 ENCOUNTER — Ambulatory Visit (INDEPENDENT_AMBULATORY_CARE_PROVIDER_SITE_OTHER): Payer: Medicaid Other | Admitting: Internal Medicine

## 2013-07-02 ENCOUNTER — Encounter: Payer: Self-pay | Admitting: Internal Medicine

## 2013-07-02 VITALS — BP 116/78 | HR 87 | Temp 98.0°F | Ht 62.0 in | Wt 152.8 lb

## 2013-07-02 DIAGNOSIS — F411 Generalized anxiety disorder: Secondary | ICD-10-CM

## 2013-07-02 DIAGNOSIS — G4700 Insomnia, unspecified: Secondary | ICD-10-CM

## 2013-07-02 DIAGNOSIS — F339 Major depressive disorder, recurrent, unspecified: Secondary | ICD-10-CM

## 2013-07-02 DIAGNOSIS — L301 Dyshidrosis [pompholyx]: Secondary | ICD-10-CM

## 2013-07-02 DIAGNOSIS — F419 Anxiety disorder, unspecified: Secondary | ICD-10-CM

## 2013-07-02 DIAGNOSIS — M79609 Pain in unspecified limb: Secondary | ICD-10-CM

## 2013-07-02 MED ORDER — ZOLPIDEM TARTRATE 5 MG PO TABS: 5.0000 mg | ORAL_TABLET | Freq: Every evening | ORAL | Status: DC | PRN

## 2013-07-02 MED ORDER — CLOBETASOL PROPIONATE 0.05 % EX CREA: 1.0000 "application " | TOPICAL_CREAM | Freq: Every day | CUTANEOUS | Status: DC | PRN

## 2013-07-02 MED ORDER — SERTRALINE HCL 50 MG PO TABS: 50.0000 mg | ORAL_TABLET | Freq: Every day | ORAL | Status: DC

## 2013-07-02 MED ORDER — HYDROCODONE-ACETAMINOPHEN 5-500 MG PO TABS: 1.0000 | ORAL_TABLET | Freq: Four times a day (QID) | ORAL | Status: DC | PRN

## 2013-07-02 NOTE — Patient Instructions (Signed)
1. Please schedule a follow up appointment for 2-3 weeks.  2. Please take all medications as prescribed. Please start taking Zoloft 50 mg once daily.  3. If you have worsening of your symptoms or new symptoms arise, please call the clinic ([phone]), or go to the ER immediately if symptoms are severe.

## 2013-07-02 NOTE — Progress Notes (Signed)
Patient ID: MINERVA BLUETT, female   DOB: August 18, 1976, 37 y.o.   MRN: [id number]   Subjective:   Patient ID: ANNET MANUKYAN female   DOB: 1975-08-23 37 y.o.   MRN: [id number]  HPI: Ms.Lorielle L Renwick is a 38 y.o. w/ PMHx of depression, anxiety, BV and GC, presents to the clinic today for follow up. Patient is complaining today of "sadness" for the past 7 months that has started to interfere with her ability to perform well in school and other normal activities. She claims to have ongoing anhedonia, decrease in energy, irritability, guilt, difficulty concentrating, and decreased appetite. When asked about suicidal ideations, the patient claims she would never commit suicide, but has thought about driving her car off the road before and knows where she would do it. Denies homicidal ideations. Patient has had issues with anxiety and depression as well as insomnia in the past. She has used Xanax for a short period of time as well as Ambien for sleep and is requesting these medications during this visit.  She otherwise denies any other health issues. No recent illness, cough, fever, chest pain, SOB, N/V/D/C.   Patient lives at home by herself with her 2 children and is currently attending medical assistant school, which has been especially difficult for her recently given her depression, children, and a recent break up.     Past Medical History  Diagnosis Date  . STD (female)   . Dyshidrotic eczema   . Gonococcal cervicitis   . Depression   . Gardnerella vaginitis   . Acne vulgaris    Current Outpatient Prescriptions  Medication Sig Dispense Refill  . clobetasol cream (TEMOVATE) 0.05 % Apply 1 application topically daily as needed. For eczema.  30 g  1  . zolpidem (AMBIEN) 5 MG tablet Take 1 tablet (5 mg total) by mouth at bedtime as needed for sleep.  30 tablet  0   No current facility-administered medications for this visit.   Family History  Problem Relation Age of Onset  . Stroke  Mother     age 55 when she had the stroke  . Hyperlipidemia Father    History   Social History  . Marital Status: Single    Spouse Name: N/A    Number of Children: N/A  . Years of Education: N/A   Social History Main Topics  . Smoking status: Never Smoker   . Smokeless tobacco: None  . Alcohol Use: No  . Drug Use: None  . Sexual Activity: None   Other Topics Concern  . None   Social History Narrative   Has 2 children.   Father's children passed away from ruptured aneurysm in 2005.   Works at [employer].   Review of Systems: General: Positive for fatigue. Denies fever, chills, diaphoresis, appetite change.  Respiratory: Denies SOB, DOE, cough, chest tightness, and wheezing.   Cardiovascular: Denies chest pain, palpitations and leg swelling.  Gastrointestinal: Denies nausea, vomiting, abdominal pain, diarrhea, constipation, blood in stool and abdominal distention.  Genitourinary: Denies dysuria, urgency, frequency, hematuria, flank pain and difficulty urinating.  Endocrine: Denies hot or cold intolerance, sweats, polyuria, polydipsia. Musculoskeletal: Positive for chronic left hand/wrist pain. Denies myalgias, back pain, joint swelling, and gait problem.  Skin: Denies pallor, rash and wounds.  Neurological: Denies dizziness, seizures, syncope, weakness, lightheadedness, numbness and headaches.  Psychiatric/Behavioral: Positive for mood changes, sleep disturbance and agitation.  Objective:  Physical Exam: Filed Vitals:   07/02/13 1555  BP: 116/78  Pulse: 87  Temp: 98 F (36.7 C)  TempSrc: Oral  Height: 5\' 2"  (1.575 m)  Weight: 152 lb 12.8 oz (69.31 kg)  SpO2: 98%   Physical Exam: General: Alert, cooperative, and in no apparent distress. Tearful on exam. HEENT: Vision grossly intact, oropharynx clear and non-erythematous  Neck: Full range of motion without pain, supple, no lymphadenopathy or carotid bruits Lungs: Clear to ascultation bilaterally, normal work of  respiration, no wheezes, rales, ronchi Heart: Regular rate and rhythm, no murmurs, gallops, or rubs Abdomen: Soft, non-tender, non-distended, normal bowel sounds Extremities: No cyanosis, clubbing, or edema Neurologic: Alert & oriented X3, cranial nerves II-XII intact, strength grossly intact, sensation intact to light touch  Assessment & Plan:   Please see problem-based assessment and plan.

## 2013-07-03 ENCOUNTER — Encounter: Payer: Self-pay | Admitting: Internal Medicine

## 2013-07-03 NOTE — Assessment & Plan Note (Signed)
Chronic issue for patient. On chart review, has interfered with daily activities. Has taken Vicodin 5-500 in th past and takes infrequently. On exam, tender over flexor surface of wrist. And dorsum of hand. Gave 1 refill of pain medication today as patient uses Vicodin infrequently. Will discuss further with her if this continues to be an issue and will refer patient to sports medicine/have patient comply with a pain contract.

## 2013-07-03 NOTE — Assessment & Plan Note (Signed)
Patient says she has been "sad" for 7 months now. She describes classic findings suggestive of MDD; anhedonia, irritability, guilt, decreased energy, difficulty concentration, change in appetite, and mild psychomotor disturbances. Patient dnies suicidal ideation, however she says she ahs thought about it before involving driving off the road and knows where she would do this. Strongly denies current suicidal ideations or previous concrete ideations in the past.  -Discussed at length with [name]. Refuses seeing psych at [facility] at this time.  -Will start Zoloft 50 mg po qd for now and have patient return in 2-3 weeks at which time patient can be reassessed for efficacy and dose can be increased to 100 mg po qd at that time.

## 2013-07-03 NOTE — Assessment & Plan Note (Signed)
Patient still with anxiety, however, depression seems to be her primary concern at this point. Has been given Xanax in the past for acute treatment of anxiety attacks. She is requesting Xanax on this visit, however, given more serious overlying issue of depression, will not provide Xanax at this moment. -Will start Zoloft 50 mg qd for depression and anxiety for now and have patient return in 2-3 weeks for follow up.

## 2013-07-03 NOTE — Assessment & Plan Note (Signed)
Patient still with difficulty sleeping, most likely 2/2 underlying MDD. Started Zoloft at this time, expect for sleep disturbances to change over time.  -For now, will give Ambien #30 for help falling asleep.

## 2013-07-03 NOTE — Assessment & Plan Note (Signed)
Patient still with minor eczema. Given refill of clobetasol.

## 2013-07-04 NOTE — Progress Notes (Signed)
I saw and evaluated the patient.  I personally confirmed the key portions of Dr. Jones' history and exam and reviewed pertinent patient test results.  The assessment, diagnosis, and plan were formulated together and I agree with the documentation in the resident's note. 

## 2013-07-16 ENCOUNTER — Encounter: Payer: Self-pay | Admitting: Internal Medicine

## 2013-07-16 ENCOUNTER — Ambulatory Visit (INDEPENDENT_AMBULATORY_CARE_PROVIDER_SITE_OTHER): Payer: Medicaid Other | Admitting: Internal Medicine

## 2013-07-16 VITALS — BP 123/85 | HR 76 | Temp 97.7°F | Wt 149.0 lb

## 2013-07-16 DIAGNOSIS — M79609 Pain in unspecified limb: Secondary | ICD-10-CM

## 2013-07-16 DIAGNOSIS — F410 Panic disorder [episodic paroxysmal anxiety] without agoraphobia: Secondary | ICD-10-CM

## 2013-07-16 DIAGNOSIS — F419 Anxiety disorder, unspecified: Secondary | ICD-10-CM

## 2013-07-16 DIAGNOSIS — F339 Major depressive disorder, recurrent, unspecified: Secondary | ICD-10-CM

## 2013-07-16 DIAGNOSIS — G4700 Insomnia, unspecified: Secondary | ICD-10-CM

## 2013-07-16 MED ORDER — ZALEPLON 10 MG PO CAPS: 10.0000 mg | ORAL_CAPSULE | Freq: Every evening | ORAL | Status: DC | PRN

## 2013-07-16 MED ORDER — ALPRAZOLAM 0.25 MG PO TABS: 0.2500 mg | ORAL_TABLET | Freq: Three times a day (TID) | ORAL | Status: DC | PRN

## 2013-07-16 MED ORDER — HYDROCODONE-ACETAMINOPHEN 5-325 MG PO TABS: 1.0000 | ORAL_TABLET | Freq: Four times a day (QID) | ORAL | Status: DC | PRN

## 2013-07-16 MED ORDER — SERTRALINE HCL 50 MG PO TABS: 75.0000 mg | ORAL_TABLET | Freq: Every day | ORAL | Status: DC

## 2013-07-16 MED ORDER — ALPRAZOLAM 0.25 MG PO TABS: 0.2500 mg | ORAL_TABLET | Freq: Every evening | ORAL | Status: DC | PRN

## 2013-07-16 MED ORDER — ZOLPIDEM TARTRATE 10 MG PO TABS: 5.0000 mg | ORAL_TABLET | Freq: Every evening | ORAL | Status: DC | PRN
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
## 2013-07-16 NOTE — Assessment & Plan Note (Addendum)
secondary to trauma and current social situation -plan to increase Zoloft to 75 mg qd as SSRI therapy would be more benefitial in the long term, I do feel that her symptoms are moderate to severe and would warrant a benzodiazepine for rapid resolution of her acute anxiety state.  Explained to pt that this would not be long term and would be in conjunction to getting Zoloft to theurepeutic dosage.  Pt resistant to counseling therapy but agrees to accept information on [employer].  - xanax 0.25 mg q8h prn, sonata 5 mg qhs and increase zoloft to 75 mg qd -consider long-acting benzo if continued need ie Klonipin or Librium -will likely need long-term psych management especially given signs of possible PTSD with need for antidepressants, benzodiazepines, and sedatives

## 2013-07-16 NOTE — Assessment & Plan Note (Signed)
Increased zoloft by 25 mg to 75 mg qd.  Pt still with depressive symptoms including tearfullness on exam today

## 2013-07-16 NOTE — Patient Instructions (Addendum)
We have changed your sleep medicine today to zalepon Kathaleen Bury) We have increased your Zoloft to 75 mg.  This dose may need to be increased in 1-2 weeks. We will give you a weeks worth of Xanax to help with your panic attacks. You will need to have protected sex when on this medicine. We strongly suggest that you follow-up with [facility] to help with your anxiety, panic attacks, and depression. If you have thoughts and plans of hurting yourself or anyone else, please go to the Emergency Room. We have adjusted the dosage of your pain medicine. We would like to see you back in 1-2 weeks.  Zaleplon capsules What is this medicine? ZALEPLON (ZAL e plon) is used to treat insomnia. This medicine helps you to fall asleep. This medicine may be used for other purposes; ask your health care provider or pharmacist if you have questions. COMMON BRAND NAME(S): Sonata What should I tell my health care provider before I take this medicine? They need to know if you have any of these conditions: -depression -history of a drug or alcohol abuse problem -liver disease -lung or breathing disease -suicidal thoughts -an unusual or allergic reaction to zaleplon, other medicines, foods, dyes, or preservatives -pregnant or trying to get pregnant -breast-feeding How should I use this medicine? Take this medicine by mouth with a glass of water. Follow the directions on the prescription label. It is better to take this medicine on an empty stomach and only when you are ready for bed. Do not take your medicine more often than directed. If you have been taking this medicine for several weeks and suddenly stop taking it, you may get unpleasant withdrawal symptoms. Your doctor or health care professional may want to gradually reduce the dose. Do not stop taking this medicine on your own. Always follow your doctor or health care professional's advice. Talk to your pediatrician regarding the use of this medicine in  children. Special care may be needed. Overdosage: If you think you have taken too much of this medicine contact a poison control center or emergency room at once. NOTE: This medicine is only for you. Do not share this medicine with others. What if I miss a dose? This does not apply. This medicine should only be taken immediately before going to sleep. Do not take double or extra doses. What may interact with this medicine? -barbiturate medicines for inducing sleep or treating seizures -carbamazepine -certain medicines for allergies, like azatadine, clemastine, diphenhydramine -certain medicines for depression, anxiety, or other emotional or psychiatric problems -certain medicines for pain -cimetidine -erythromycin -medicines for fungal infections like ketoconazole, fluconazole, or itraconazole -other medicines given for sleep -phenytoin -rifampin This list may not describe all possible interactions. Give your health care provider a list of all the medicines, herbs, non-prescription drugs, or dietary supplements you use. Also tell them if you smoke, drink alcohol, or use illegal drugs. Some items may interact with your medicine. What should I watch for while using this medicine? Visit your doctor or health care professional for regular checks on your progress. Keep a regular sleep schedule by going to bed at about the same time each night. Avoid caffeine-containing drinks in the evening hours. When sleep medicines are used every night for more than a few weeks, they may stop working. Talk to your doctor if you still have trouble sleeping. Do not take this medicine unless you are able to get a full night's sleep before you must be active again. You may not  be able to remember things that you do in the hours after you take this medicine. Some people have reported driving, making phone calls, or preparing and eating food while asleep after taking sleep medicine. Take this medicine right before going  to sleep. Tell your doctor if you are have any problems with your memory. After you stop taking this medicine, you may have trouble falling asleep. This is called rebound insomnia. This problem usually goes away on its own after 1 or 2 nights. You may get drowsy or dizzy. Do not drive, use machinery, or do anything that needs mental alertness until you know how this medicine affects you. Do not stand or sit up quickly, especially if you are an older patient. This reduces the risk of dizzy or fainting spells. Alcohol may interfere with the effect of this medicine. Avoid alcoholic drinks. If you or your family notice any changes in your behavior, or if you have any unusual or disturbing thoughts, call your doctor right away. What side effects may I notice from receiving this medicine? Side effects that you should report to your doctor or health care professional as soon as possible: -allergic reactions like skin rash, itching or hives, swelling of the face, lips, or tongue -breathing problems -changes in vision -confusion -depression, suicidal thoughts -feeling faint or lightheaded -hallucinations -hostility, restlessness, excitability -slurred speech -staggering, tremors -unusual activities while asleep like driving, eating, making phone calls -unusually weak or tired Side effects that usually do not require medical attention (report to your doctor or health care professional if they continue or are bothersome): -diarrhea -difficulty with coordination -loss of memory -nightmares -stomach upset This list may not describe all possible side effects. Call your doctor for medical advice about side effects. You may report side effects to FDA at 1-800-FDA-1088. Where should I keep my medicine? Keep out of the reach of children. This medicine can be abused. Keep your medicine in a safe place to protect it from theft. Do not share this medicine with anyone. Selling or giving away this medicine is  dangerous and against the law. Store at room temperature between 20 and 25 degrees C (68 and 77 degrees F). Protect from light. Throw away any unused medicine after the expiration date. NOTE: This sheet is a summary. It may not cover all possible information. If you have questions about this medicine, talk to your doctor, pharmacist, or health care provider.  2014, Elsevier/Gold Standard. (2008-05-06 14:47:48)   Alprazolam tablets What is this medicine? ALPRAZOLAM (al PRAY zoe lam) is a benzodiazepine. It is used to treat anxiety and panic attacks. This medicine may be used for other purposes; ask your health care provider or pharmacist if you have questions. COMMON BRAND NAME(S): Xanax What should I tell my health care provider before I take this medicine? They need to know if you have any of these conditions: -an alcohol or drug abuse problem -bipolar disorder, depression, psychosis or other mental health conditions -glaucoma -kidney or liver disease -lung or breathing disease -myasthenia gravis -Parkinson's disease -porphyria -seizures or a history of seizures -suicidal thoughts -an unusual or allergic reaction to alprazolam, other benzodiazepines, foods, dyes, or preservatives -pregnant or trying to get pregnant -breast-feeding How should I use this medicine? Take this medicine by mouth with a glass of water. Follow the directions on the prescription label. Take your medicine at regular intervals. Do not take it more often than directed. If you have been taking this medicine regularly for some time, do not  suddenly stop taking it. You must gradually reduce the dose or you may get severe side effects. Ask your doctor or health care professional for advice. Even after you stop taking this medicine it can still affect your body for several days. Talk to your pediatrician regarding the use of this medicine in children. Special care may be needed. Overdosage: If you think you have taken  too much of this medicine contact a poison control center or emergency room at once. NOTE: This medicine is only for you. Do not share this medicine with others. What if I miss a dose? If you miss a dose, take it as soon as you can. If it is almost time for your next dose, take only that dose. Do not take double or extra doses. What may interact with this medicine? Do not take this medicine with any of the following medications: -certain medicines for HIV infection or AIDS -ketoconazole -itraconazole This medicine may also interact with the following medications: -birth control pills -certain macrolide antibiotics like clarithromycin, erythromycin, troleandomycin -cimetidine -cyclosporine -ergotamine -grapefruit juice -herbal or dietary supplements like kava kava, melatonin, dehydroepiandrosterone, DHEA, St. John's Wort or valerian -imatinib, STI-571 -isoniazid -levodopa -medicines for depression, anxiety, or psychotic disturbances -prescription pain medicines -rifampin, rifapentine, or rifabutin -some medicines for blood pressure or heart problems -some medicines for seizures like carbamazepine, oxcarbazepine, phenobarbital, phenytoin, primidone This list may not describe all possible interactions. Give your health care provider a list of all the medicines, herbs, non-prescription drugs, or dietary supplements you use. Also tell them if you smoke, drink alcohol, or use illegal drugs. Some items may interact with your medicine. What should I watch for while using this medicine? Visit your doctor or health care professional for regular checks on your progress. Your body can become dependent on this medicine. Ask your doctor or health care professional if you still need to take it. You may get drowsy or dizzy. Do not drive, use machinery, or do anything that needs mental alertness until you know how this medicine affects you. To reduce the risk of dizzy and fainting spells, do not stand or  sit up quickly, especially if you are an older patient. Alcohol may increase dizziness and drowsiness. Avoid alcoholic drinks. Do not treat yourself for coughs, colds or allergies without asking your doctor or health care professional for advice. Some ingredients can increase possible side effects. What side effects may I notice from receiving this medicine? Side effects that you should report to your doctor or health care professional as soon as possible: -allergic reactions like skin rash, itching or hives, swelling of the face, lips, or tongue -confusion, forgetfulness -depression -difficulty sleeping -difficulty speaking -feeling faint or lightheaded, falls -mood changes, excitability or aggressive behavior -muscle cramps -trouble passing urine or change in the amount of urine -unusually weak or tired Side effects that usually do not require medical attention (report to your doctor or health care professional if they continue or are bothersome): -change in sex drive or performance -changes in appetite This list may not describe all possible side effects. Call your doctor for medical advice about side effects. You may report side effects to FDA at 1-800-FDA-1088. Where should I keep my medicine? Keep out of the reach of children. This medicine can be abused. Keep your medicine in a safe place to protect it from theft. Do not share this medicine with anyone. Selling or giving away this medicine is dangerous and against the law. Store at room temperature between 20  and 25 degrees C (68 and 77 degrees F). Throw away any unused medicine after the expiration date. NOTE: This sheet is a summary. It may not cover all possible information. If you have questions about this medicine, talk to your doctor, pharmacist, or health care provider.  2014, Elsevier/Gold Standard. (2007-11-01 10:34:46)   Sertraline tablets What is this medicine? SERTRALINE (SER tra leen) is used to treat depression. It  may also be used to treat obsessive compulsive disorder, panic disorder, post-trauma stress, premenstrual dysphoric disorder (PMDD) or social anxiety. This medicine may be used for other purposes; ask your health care provider or pharmacist if you have questions. COMMON BRAND NAME(S): Zoloft What should I tell my health care provider before I take this medicine? They need to know if you have any of these conditions: -bipolar disorder or a family history of bipolar disorder -diabetes -glaucoma -heart disease -high blood pressure -history of irregular heartbeat -history of low levels of calcium, magnesium, or potassium in the blood -if you often drink alcohol -liver disease -receiving electroconvulsive therapy -seizures -suicidal thoughts, plans, or attempt; a previous suicide attempt by you or a family member -thyroid disease -an unusual or allergic reaction to sertraline, other medicines, foods, dyes, or preservatives -pregnant or trying to get pregnant -breast-feeding How should I use this medicine? Take this medicine by mouth with a glass of water. Follow the directions on the prescription label. You can take it with or without food. Take your medicine at regular intervals. Do not take your medicine more often than directed. Do not stop taking this medicine suddenly except upon the advice of your doctor. Stopping this medicine too quickly may cause serious side effects or your condition may worsen. A special MedGuide will be given to you by the pharmacist with each prescription and refill. Be sure to read this information carefully each time. Talk to your pediatrician regarding the use of this medicine in children. While this drug may be prescribed for children as young as 7 years for selected conditions, precautions do apply. Overdosage: If you think you have taken too much of this medicine contact a poison control center or emergency room at once. NOTE: This medicine is only for you. Do  not share this medicine with others. What if I miss a dose? If you miss a dose, take it as soon as you can. If it is almost time for your next dose, take only that dose. Do not take double or extra doses. What may interact with this medicine? Do not take this medicine with any of the following medications: -linezolid -MAOIs like Carbex, Eldepryl, Marplan, Nardil, and Parnate -methylene blue (injected into a vein) -pimozide -thioridazine This medicine may also interact with the following medications: -alcohol -aspirin and aspirin-like medicines -certain medicines for depression, anxiety, or psychotic disturbances -certain medicines for migraine headaches like almotriptan, eletriptan, frovatriptan, naratriptan, rizatriptan, sumatriptan, zolmitriptan -certain medicines for seizures like carbamazepine, valproic acid, phenytoin -cimetidine -digoxin -diuretics -fentanyl -furazolidone -isoniazid -lithium -medicines that treat or prevent blood clots like warfarin, enoxaparin, and dalteparin -medicines to control heart rhythm like flecainide or propafenone -medicines for sleep -NSAIDs, medicines for pain and inflammation, like ibuprofen or naproxen -procarbazine -rasagiline -supplements like St. John's wort, kava kava, valerian -tolbutamide -tramadol -tryptophan This list may not describe all possible interactions. Give your health care provider a list of all the medicines, herbs, non-prescription drugs, or dietary supplements you use. Also tell them if you smoke, drink alcohol, or use illegal drugs. Some items may interact  with your medicine. What should I watch for while using this medicine? Tell your doctor if your symptoms do not get better or if they get worse. Visit your doctor or health care professional for regular checks on your progress. Because it may take several weeks to see the full effects of this medicine, it is important to continue your treatment as prescribed by your  doctor. Patients and their families should watch out for new or worsening thoughts of suicide or depression. Also watch out for sudden changes in feelings such as feeling anxious, agitated, panicky, irritable, hostile, aggressive, impulsive, severely restless, overly excited and hyperactive, or not being able to sleep. If this happens, especially at the beginning of treatment or after a change in dose, call your health care professional. Bonita Quin may get drowsy or dizzy. Do not drive, use machinery, or do anything that needs mental alertness until you know how this medicine affects you. Do not stand or sit up quickly, especially if you are an older patient. This reduces the risk of dizzy or fainting spells. Alcohol may interfere with the effect of this medicine. Avoid alcoholic drinks. Your mouth may get dry. Chewing sugarless gum or sucking hard candy, and drinking plenty of water may help. Contact your doctor if the problem does not go away or is severe. What side effects may I notice from receiving this medicine? Side effects that you should report to your doctor or health care professional as soon as possible: -allergic reactions like skin rash, itching or hives, swelling of the face, lips, or tongue -black or bloody stools, blood in the urine or vomit -fast, irregular heartbeat -feeling faint or lightheaded, falls -hallucination, loss of contact with reality -seizures -suicidal thoughts or other mood changes -unusual bleeding or bruising -unusually weak or tired -vomiting Side effects that usually do not require medical attention (report to your doctor or health care professional if they continue or are bothersome): -change in appetite -change in sex drive or performance -diarrhea -increased sweating -indigestion, nausea -tremors This list may not describe all possible side effects. Call your doctor for medical advice about side effects. You may report side effects to FDA at  1-800-FDA-1088. Where should I keep my medicine? Keep out of the reach of children. Store at room temperature between 15 and 30 degrees C (59 and 86 degrees F). Throw away any unused medicine after the expiration date. NOTE: This sheet is a summary. It may not cover all possible information. If you have questions about this medicine, talk to your doctor, pharmacist, or health care provider.  2014, Elsevier/Gold Standard. (2013-03-01 17:38:48)

## 2013-07-17 NOTE — Progress Notes (Signed)
Case discussed with Dr. Schooler soon after the resident saw the patient.  We reviewed the resident's history and exam and pertinent patient test results.  I agree with the assessment, diagnosis, and plan of care documented in the resident's note. 

## 2013-07-20 ENCOUNTER — Emergency Department (HOSPITAL_COMMUNITY)
Admission: EM | Admit: 2013-07-20 | Discharge: 2013-07-20 | Disposition: A | Discharge status: Home / Self Care | Payer: Medicaid Other | Attending: Emergency Medicine | Admitting: Emergency Medicine

## 2013-07-20 ENCOUNTER — Encounter (HOSPITAL_COMMUNITY): Payer: Self-pay | Admitting: Emergency Medicine

## 2013-07-20 DIAGNOSIS — Z872 Personal history of diseases of the skin and subcutaneous tissue: Secondary | ICD-10-CM | POA: Insufficient documentation

## 2013-07-20 DIAGNOSIS — K130 Diseases of lips: Secondary | ICD-10-CM

## 2013-07-20 DIAGNOSIS — Y929 Unspecified place or not applicable: Secondary | ICD-10-CM | POA: Insufficient documentation

## 2013-07-20 DIAGNOSIS — S01501A Unspecified open wound of lip, initial encounter: Secondary | ICD-10-CM | POA: Insufficient documentation

## 2013-07-20 DIAGNOSIS — F3289 Other specified depressive episodes: Secondary | ICD-10-CM | POA: Insufficient documentation

## 2013-07-20 DIAGNOSIS — Z8619 Personal history of other infectious and parasitic diseases: Secondary | ICD-10-CM | POA: Insufficient documentation

## 2013-07-20 DIAGNOSIS — Z79899 Other long term (current) drug therapy: Secondary | ICD-10-CM | POA: Insufficient documentation

## 2013-07-20 DIAGNOSIS — X58XXXA Exposure to other specified factors, initial encounter: Secondary | ICD-10-CM | POA: Insufficient documentation

## 2013-07-20 DIAGNOSIS — F329 Major depressive disorder, single episode, unspecified: Secondary | ICD-10-CM | POA: Insufficient documentation

## 2013-07-20 DIAGNOSIS — Y939 Activity, unspecified: Secondary | ICD-10-CM | POA: Insufficient documentation

## 2013-07-20 MED ORDER — CLINDAMYCIN HCL 150 MG PO CAPS: 300.0000 mg | ORAL_CAPSULE | Freq: Three times a day (TID) | ORAL | Status: DC

## 2013-07-20 MED ORDER — HYDROCODONE-HOMATROPINE 5-1.5 MG/5ML PO SYRP: 5.0000 mL | ORAL_SOLUTION | Freq: Four times a day (QID) | ORAL | Status: DC | PRN

## 2013-07-20 MED ORDER — ALBUTEROL SULFATE HFA 108 (90 BASE) MCG/ACT IN AERS: 2.0000 | INHALATION_SPRAY | Freq: Once | RESPIRATORY_TRACT | Status: DC

## 2013-07-20 NOTE — ED Notes (Signed)
Pt states she has a piercing stuck in her upper right lip.  Pt went to piercing place today and they removed the ball from the top, but the post remains in pt's lip.  Pt has mild swelling around ring in upper lip.  Pt has had clear drainage from area where ring is stuck.  Pt denies N/V and fever.

## 2013-07-20 NOTE — ED Provider Notes (Signed)
CSN: [id number]     Arrival date & time 07/20/13  1618 History   This chart was scribed for non-physician practitioner Arthor Captain, PA-C, working with Gilda Crease, DM, by Yevette Edwards, ED Scribe. This patient was seen in room WTR8/WTR8 and the patient's care was started at 5:20 PM.  First MD Initiated Contact with Patient 07/20/13 1700     Chief Complaint  Patient presents with  . Illegal value: [    piercing stuck in upper right lip   The history is provided by the patient. No language interpreter was used.   HPI Comments: [name] is a 37 y.o. female who presents to the Emergency Department complaining of a piercing which is embedded in her right upper lip. She has had the piercing for two years, and has only recently noticed mild swelling around the site. The pt has experienced pain  as well as clear drainage from the site. The pt visited a piercing place today where they were able to remove the ball, but the post remains embedded in her right upper lip.  She denies experiencing nausea, emesis, or a fever.  The pt had a tetanus within the last ten years. The pt is a non-smoker.   Past Medical History  Diagnosis Date  . STD (female)   . Dyshidrotic eczema   . Gonococcal cervicitis   . Depression   . Gardnerella vaginitis   . Acne vulgaris    Past Surgical History  Procedure Laterality Date  . Tubal ligation  06/2004    bilateral   Family History  Problem Relation Age of Onset  . Stroke Mother     age 25 when she had the stroke  . Hyperlipidemia Father    History  Substance Use Topics  . Smoking status: Never Smoker   . Smokeless tobacco: Never Used  . Alcohol Use: No   No OB history provided.  Review of Systems  Constitutional: Negative for fever and chills.  Gastrointestinal: Negative for nausea and vomiting.  Skin: Positive for wound.  All other systems reviewed and are negative.   Allergies  Review of patient's allergies indicates no  known allergies.  Home Medications   Current Outpatient Rx  Name  Route  Sig  Dispense  Refill  . ALPRAZolam (XANAX) 0.25 MG tablet   Oral   Take 1 tablet (0.25 mg total) by mouth 3 (three) times daily as needed for anxiety.   30 tablet   0   . clobetasol cream (TEMOVATE) 0.05 %   Topical   Apply 1 application topically daily as needed. For eczema.   30 g   1   . HYDROcodone-acetaminophen (NORCO/VICODIN) 5-325 MG per tablet   Oral   Take 1 tablet by mouth every 6 (six) hours as needed for moderate pain.   30 tablet   0   . sertraline (ZOLOFT) 50 MG tablet   Oral   Take 1.5 tablets (75 mg total) by mouth daily.   30 tablet   2   . zaleplon (SONATA) 10 MG capsule   Oral   Take 1 capsule (10 mg total) by mouth at bedtime as needed for sleep.   30 capsule   0   . zolpidem (AMBIEN) 10 MG tablet   Oral   Take 0.5 tablets (5 mg total) by mouth at bedtime as needed for sleep.   30 tablet   0    Triage Vitals: BP 114/75  Pulse 87  Temp(Src) 98.5  F (36.9 C) (Oral)  Resp 18  SpO2 99%  LMP 07/05/2013  Physical Exam  Nursing note and vitals reviewed. Constitutional: She is oriented to person, place, and time. She appears well-developed and well-nourished. No distress.  HENT:  Head: Normocephalic and atraumatic.  Eyes: EOM are normal.  Neck: Neck supple. No tracheal deviation present.  Cardiovascular: Normal rate.   Pulmonary/Chest: Effort normal. No respiratory distress.  Musculoskeletal: Normal range of motion.  Neurological: She is alert and oriented to person, place, and time.  Skin: Skin is warm and dry.  Swollen right upper lip. Purulent drainage from site of piercing.   Psychiatric: She has a normal mood and affect. Her behavior is normal.    ED Course  FOREIGN BODY REMOVAL Date/Time: 07/20/2013 5:48 PM Performed by: Arthor Captain Authorized by: Arthor Captain Consent: Verbal consent obtained. written consent obtained. Consent given by:  patient Patient understanding: patient states understanding of the procedure being performed Patient identity confirmed: verbally with patient Body area: mucosa General location: head/neck Location details: mouth Anesthesia: local infiltration Local anesthetic: bupivacaine 0.5% with epinephrine Anesthetic total: 0.8 ml Patient sedated: no Patient cooperative: yes Localization method: probed Removal mechanism: forceps 1 objects recovered. Objects recovered: 1 Post-procedure assessment: foreign body removed Patient tolerance: Patient tolerated the procedure well with no immediate complications.   (including critical care time)  DIAGNOSTIC STUDIES: Oxygen Saturation is 99% on room air, normal by my interpretation.    COORDINATION OF CARE:  5:22 PM- Consulted with Dr. Blinda Leatherwood.  5:30 PM- Discussed treatment plan with patient which includes removing the foreign body from her lip, and the patient agreed to the plan.    Labs Review Labs Reviewed - No data to display Imaging Review No results found.  EKG Interpretation   None       MDM   1. Infected pierced lip    Patient with infected lip appears in.  Foreign body removed. Area flushed. Will d/c patient with clindamycin. Patient will f/u with pcp for wound check  Monday.  I personally performed the services described in this documentation, which was scribed in my presence. The recorded information has been reviewed and is accurate.     Arthor Captain, PA-C 07/21/13 [phone]

## 2013-07-22 NOTE — ED Provider Notes (Signed)
Medical screening examination/treatment/procedure(s) were performed by non-physician practitioner and as supervising physician I was immediately available for consultation/collaboration.  EKG Interpretation   None         Gilda Crease, MD 07/22/13 [phone]

## 2013-07-24 ENCOUNTER — Ambulatory Visit: Payer: Medicaid Other | Admitting: Internal Medicine

## 2013-08-09 ENCOUNTER — Ambulatory Visit (INDEPENDENT_AMBULATORY_CARE_PROVIDER_SITE_OTHER): Payer: Medicaid Other | Admitting: Internal Medicine

## 2013-08-09 VITALS — BP 117/80 | HR 111 | Temp 97.7°F | Ht 62.5 in | Wt 151.7 lb

## 2013-08-09 DIAGNOSIS — G4700 Insomnia, unspecified: Secondary | ICD-10-CM

## 2013-08-09 DIAGNOSIS — F410 Panic disorder [episodic paroxysmal anxiety] without agoraphobia: Secondary | ICD-10-CM

## 2013-08-09 DIAGNOSIS — F419 Anxiety disorder, unspecified: Secondary | ICD-10-CM

## 2013-08-09 DIAGNOSIS — F339 Major depressive disorder, recurrent, unspecified: Secondary | ICD-10-CM

## 2013-08-09 MED ORDER — SERTRALINE HCL 100 MG PO TABS: 100.0000 mg | ORAL_TABLET | Freq: Every day | ORAL | Status: DC

## 2013-08-09 MED ORDER — ZOLPIDEM TARTRATE 5 MG PO TABS: 5.0000 mg | ORAL_TABLET | Freq: Every evening | ORAL | Status: DC | PRN

## 2013-08-09 NOTE — Assessment & Plan Note (Signed)
Assessment:  The patient has some symptoms of depression but she also reports periods of excessive talking which could be consistent with bipolar disorder or other diagnoses.  She has been hesitant about referral for behavioral health services in the past but I would recommend she see them for further evaluation and recommendations for appropriate therapy.  Plan:   1) patient is agreeable to referral to behavioral health  2) increase Zoloft from 50mg  daily to 100mg  daily

## 2013-08-09 NOTE — Patient Instructions (Addendum)
1. Please increase Zoloft to 100mg  once per day.    2. Take ambien 5mg  at night if you need it.  3. Decrease your xanax use.  You have been taking 2 pills in the morning (.5mg  total).  Please decrease to 1 pill in the mornings this week.  Half a pill in the mornings next week.   4. You will be contacted regarding following-up with psychiatry.  5. Stop taking Vicodin prn for hand pain.  Try tylenol or ibuprofen for pain relief.

## 2013-08-09 NOTE — Progress Notes (Signed)
   Subjective:    Patient ID: [name], female    DOB: [birth year], 37 y.o.   MRN: [id number]  HPI Comments: [name] is a 37 year old woman with PMH of depression and insomnia.  She was previously prescribed Ambien 5mg  qHS for her problems sleeping but reported that Ambien was not helping.  She was switched to [facility] but she says that is not work.  She says the Ambien works better than the [employer] for her.  She still has complaint of trouble falling asleep and frequent awakenings during the night.  She says she is still depressed over recent break-up but she finds comfort in talking to her friends and still has things she likes to do for fun.  She reports improved appetite and denies HI.  When asked about SI she says she has thought about driving her car off the road but she says she would not do that because "she has too much going on right now."  She has declined referral for behavioral health services in the past.  She has been taking Zoloft 50mg  for about 6 weeks.  This medication was increased to 75mg  at a visit 3 weeks ago but she said she never increased and has only been taking one 50mg  pill.  She also reports anxiety in the morning time and says she has recently increased her xanax use to two (0.25mg ) pills in the morning in order to get through her day, take care of her kids and go to school.       Review of Systems  Constitutional: Negative for appetite change.  Respiratory: Negative for shortness of breath.   Cardiovascular: Negative for chest pain.  Gastrointestinal: Negative for abdominal pain, diarrhea and constipation.  Genitourinary: Negative for dysuria.  Psychiatric/Behavioral: Positive for sleep disturbance. Negative for suicidal ideas. The patient is nervous/anxious.        She reports talking very fast at times.       Objective:   Physical Exam  Vitals reviewed. Constitutional: She is oriented to person, place, and time. She appears well-developed and  well-nourished. No distress.  HENT:  Mouth/Throat: Oropharynx is clear and moist. No oropharyngeal exudate.  Eyes: Pupils are equal, round, and reactive to light.  Neck: Neck supple.  Cardiovascular: Normal rate, regular rhythm and normal heart sounds.   Pulmonary/Chest: Effort normal and breath sounds normal. No respiratory distress. She has no wheezes. She has no rales.  Abdominal: Soft. Bowel sounds are normal. She exhibits no distension. There is no tenderness.  Musculoskeletal: Normal range of motion.  Neurological: She is alert and oriented to person, place, and time.  Skin: Skin is warm. She is not diaphoretic.  Psychiatric: Her behavior is normal.  Tearful at times.          Assessment & Plan:  Please see problem based assessment and plan.

## 2013-08-09 NOTE — Assessment & Plan Note (Addendum)
Treatment of depression may improve her insomnia.  However, there is potential for insomnia ADR with Zoloft.  Advised patient to take Ambien 5mg  qHS as needed.  I called her pharmacy to cancel [place].  UDS in clinic today.  If it is positive will not be able to prescribe Ambien or narcotics.

## 2013-08-09 NOTE — Assessment & Plan Note (Signed)
Patient anxious in the morning.  Zoloft should help with her anxiety as well as depression.  Advised her to begin weaning off of xanax as it is not appropriate for long term treatment.  She is agreeable to decreasing her dose over the next few weeks.    Plan:    1) increase Zoloft to 100mg  daily  2) wean xanax  3) referral to behavioral health

## 2013-08-10 LAB — PRESCRIPTION ABUSE MONITORING 15P, URINE
Carisoprodol, Urine: NEGATIVE ng/mL
Methadone Screen, Urine: NEGATIVE ng/mL
Opiate Screen, Urine: NEGATIVE ng/mL
Oxycodone Screen, Ur: NEGATIVE ng/mL
Tramadol Scrn, Ur: NEGATIVE ng/mL
Zolpidem, Urine: NEGATIVE ng/mL

## 2013-08-12 ENCOUNTER — Telehealth: Payer: Self-pay | Admitting: Licensed Clinical Social Worker

## 2013-08-12 NOTE — Telephone Encounter (Signed)
Ms. Dortch has been referred to CSW as pt is now agreeable to behavioral health services.  Pt is insured through [facility] and has multiple options.  CSW placed called to pt.  CSW left message requesting return call. CSW provided contact hours and phone number.

## 2013-08-13 LAB — BENZODIAZEPINES (GC/LC/MS), URINE
Alprazolam metabolite (GC/LC/MS), ur confirm: 570 ng/mL — ABNORMAL HIGH
Clonazepam metabolite (GC/LC/MS), ur confirm: NEGATIVE ng/mL
Diazepam (GC/LC/MS), ur confirm: NEGATIVE ng/mL
Flunitrazepam metabolite (GC/LC/MS), ur confirm: NEGATIVE ng/mL
Flurazepam metabolite (GC/LC/MS), ur confirm: NEGATIVE ng/mL
Lorazepam (GC/LC/MS), ur confirm: NEGATIVE ng/mL
Midazolam (GC/LC/MS), ur confirm: NEGATIVE ng/mL
Nordiazepam (GC/LC/MS), ur confirm: NEGATIVE ng/mL
Triazolam metabolite (GC/LC/MS), ur confirm: NEGATIVE ng/mL

## 2013-08-13 NOTE — Progress Notes (Signed)
I saw and evaluated the patient. I personally confirmed the key portions of Dr. Wilson's history and exam and reviewed pertinent patient test results. The assessment, diagnosis, and plan were formulated together and I agree with the documentation in the resident's note. 

## 2013-08-14 LAB — CANNABANOIDS (GC/LC/MS), URINE: THC-COOH (GC/LC/MS), ur confirm: >1000 ng/mL

## 2013-08-23 ENCOUNTER — Other Ambulatory Visit: Payer: Self-pay | Admitting: Internal Medicine

## 2013-08-23 NOTE — Telephone Encounter (Signed)
CSW placed called to pt.  CSW left message requesting return call. CSW provided contact hours and phone number. 

## 2013-08-26 NOTE — Telephone Encounter (Signed)
CSW placed called to pt.  CSW left message requesting return call. CSW provided contact hours and phone number.  Letter mailed.  CSW will sign off as there has been no response from [name].

## 2013-08-27 NOTE — Telephone Encounter (Signed)
Called to pharm 

## 2014-07-28 ENCOUNTER — Ambulatory Visit (INDEPENDENT_AMBULATORY_CARE_PROVIDER_SITE_OTHER): Payer: Medicaid Other | Admitting: Internal Medicine

## 2014-07-28 ENCOUNTER — Encounter: Payer: Self-pay | Admitting: Internal Medicine

## 2014-07-28 VITALS — BP 131/73 | HR 74 | Temp 98.4°F | Ht 63.0 in | Wt 146.5 lb

## 2014-07-28 DIAGNOSIS — H6092 Unspecified otitis externa, left ear: Secondary | ICD-10-CM

## 2014-07-28 DIAGNOSIS — G4700 Insomnia, unspecified: Secondary | ICD-10-CM

## 2014-07-28 DIAGNOSIS — L301 Dyshidrosis [pompholyx]: Secondary | ICD-10-CM

## 2014-07-28 DIAGNOSIS — F339 Major depressive disorder, recurrent, unspecified: Secondary | ICD-10-CM

## 2014-07-28 MED ORDER — TEMAZEPAM 15 MG PO CAPS: 15.0000 mg | ORAL_CAPSULE | Freq: Every evening | ORAL | Status: DC | PRN

## 2014-07-28 MED ORDER — NEOMYCIN-POLYMYXIN-HC 3.5-10000-1 OT SOLN: 3.0000 [drp] | Freq: Four times a day (QID) | OTIC | Status: DC

## 2014-07-28 MED ORDER — SERTRALINE HCL 50 MG PO TABS: 50.0000 mg | ORAL_TABLET | Freq: Every day | ORAL | Status: DC

## 2014-07-28 MED ORDER — CLOBETASOL PROPIONATE 0.05 % EX CREA: 1.0000 "application " | TOPICAL_CREAM | Freq: Every day | CUTANEOUS | Status: DC | PRN

## 2014-07-28 NOTE — Progress Notes (Signed)
Subjective:   Patient ID: [name] female   DOB: 1975-10-31 38 y.o.   MRN: [id number]  HPI: [name] is a 38 y.o. female w/ PMHx of depression and anxiety, presents to the clinic today for a follow-up visit. Patient was last seen on 08/09/14 for management of her depression and anxiety. Prior to that visit, the patient was started on Zoloft 50 mg daily which was increased progressively to 100 mg daily. Since that time, the patient claims she has been doing about the same. After further discussion, it seems that the patient only took the Zoloft for about 2 weeks and then stopped because it wasn't helping her. She continues to have feelings of sadness, decreased energy, difficulty sleeping, hopelessness, changes in her appetite, intermittent irritability and anger, and significant anxiety involving her work and family. She continues to deny true suicidal ideation, however, still says she has thought about this in the past but would never go through with it because of her children. The patient denies symptoms of mania such as increased energy level, frivolous spending, or thoughtless and spontaneous acts.  The patient has been having issues with insomnia for quite some time. She had tried Ambien in the past but says that this would not help her stay asleep. She continues to take Xanax for this but says that she has the same issues. She claims she will fall asleep when she takes the Xanax but will then wake up 3-4 hours later and feel like she needs to take another pill.  The patient also complains of left ear pain which she has had for a couple days now. On exam, the ear is mildly tender to the touch. Ear exam reveals mild erythema of the left ear canal. The patient claims she has had "swimmer's ear" in the past and says that this is very similar.   Past Medical History  Diagnosis Date  . STD (female)   . Dyshidrotic eczema   . Gonococcal cervicitis   . Depression   . Gardnerella  vaginitis   . Acne vulgaris    Current Outpatient Prescriptions  Medication Sig Dispense Refill  . ALPRAZolam (XANAX) 0.25 MG tablet take 1 tablet by mouth once daily if needed 15 tablet 0  . clobetasol cream (TEMOVATE) 0.05 % Apply 1 application topically daily as needed. For eczema. 30 g 1  . sertraline (ZOLOFT) 100 MG tablet Take 1 tablet (100 mg total) by mouth daily. 30 tablet 2  . zolpidem (AMBIEN) 5 MG tablet take 1 tablet by mouth at bedtime if needed for sleep 30 tablet 0   No current facility-administered medications for this visit.    Review of Systems: General: Positive for fatigue. Denies fever, chills, diaphoresis, appetite change.  Respiratory: Denies SOB, DOE, cough, and wheezing.   Cardiovascular: Denies chest pain and palpitations.  Gastrointestinal: Denies nausea, vomiting, abdominal pain, and diarrhea.  Genitourinary: Denies dysuria, increased frequency, and flank pain. Endocrine: Denies hot or cold intolerance, polyuria, and polydipsia. Musculoskeletal: Denies myalgias, back pain, joint swelling, arthralgias and gait problem.  Skin: Denies pallor, rash and wounds.  Neurological: Positive for left ear pain. Denies dizziness, seizures, syncope, weakness, lightheadedness, numbness and headaches.  Psychiatric/Behavioral: Positive for depressed/anxious mood and insomnia.    Objective:   Physical Exam: Filed Vitals:   07/28/14 1607  BP: 131/73  Pulse: 74  Temp: 98.4 F (36.9 C)  TempSrc: Oral  Height: 5\' 3"  (1.6 m)  Weight: 146 lb 8 oz (66.452 kg)  SpO2: 100%    General: AA female, alert, cooperative, NAD. Mildly tearful on exam.  HEENT: PERRL, EOMI. Moist mucus membranes. Left ear w/ mild erythema of the outer ear. Tenderness to palpation over helix/antihelix/tragus. No erythema of the tympanic membrane, no effusion.  Neck: Full range of motion without pain, supple, no lymphadenopathy or carotid bruits Lungs: Clear to ascultation bilaterally, normal work of  respiration, no wheezes, rales, rhonchi Heart: RRR, no murmurs, gallops, or rubs Abdomen: Soft, non-tender, non-distended, BS + Extremities: No cyanosis, clubbing, or edema Neurologic: Alert & oriented X3, cranial nerves II-XII intact, strength grossly intact, sensation intact to light touch   Assessment & Plan:   Please see problem based assessment and plan.

## 2014-07-28 NOTE — Patient Instructions (Addendum)
General Instructions:  1. Please schedule a follow up appointment for 4-6 weeks.  2. Please take all medications as prescribed.   Continue Zoloft 50 mg daily. THIS MEDICATION TAKES UP TO 6 WEEKS TO TAKE EFFECT. We may also need to increase the dose at your next appointment.   Please start taking Restoril 15 mg at night for sleep.  Use ear drops as prescribed (4 drops every 4 hours for 7 days).  3. If you have worsening of your symptoms or new symptoms arise, please call the clinic [phone]), or go to the ER immediately if symptoms are severe.   Please bring your medicines with you each time you come to clinic.  Medicines may include prescription medications, over-the-counter medications, herbal remedies, eye drops, vitamins, or other pills.

## 2014-07-29 ENCOUNTER — Encounter: Payer: Self-pay | Admitting: Internal Medicine

## 2014-07-29 NOTE — Assessment & Plan Note (Addendum)
The patient continues to have feelings of sadness, decreased energy, difficulty sleeping, hopelessness, changes in her appetite, intermittent irritability and anger, and significant anxiety involving her work and family. She continues to deny true suicidal ideation, however, still says she has thought about this in the past but would never go through with it because of her children. No symptoms of mania reported. The patient is also tearful on exam, seems to have a lot on her mind. 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  [name] had previously been started on Zoloft 50 mg daily and had the dose increased to 100 mg daily. However, after further discussion, it seems that the patient only took the medicine for about 2 weeks, then stopped. I still firmly believe that this medicine would help her significantly given her mixed symptoms of anxiety and depression. Discussed the idea that this medicine will take up to 6 weeks to be effective, even at that time, the dose may need to be adjusted.  -Restart Zoloft 50 mg daily for now; patient will return in 6 weeks for further adjustment of her medication -Agreed to try medicine for prolonged course and not discontinue.  -Restoril 15 mg qhs for sleep aid in place of Xanax

## 2014-07-29 NOTE — Assessment & Plan Note (Signed)
Refilled Clobetasol cream

## 2014-07-29 NOTE — Assessment & Plan Note (Signed)
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 She claims she has been having issues w/ insomnia for quite some time and claims her issue is both falling asleep and staying asleep. She has tried Ambien in the past which she says did not help her stay asleep (expected of this medication). Instead, she has been taking Xanax 0.25 mg qhs to help her with sleep, but also says that she wakes up again around 2-3 AM, also expected given the short half life of Xanax.  -Started Zoloft for better control of depression; I do understand that this may cause mild insomnia, however, given that her difficulty with sleep is very likely associated with anxiety and depression, the benefits of this medication  outweigh the potential contribution to insomnia.  -Start Restoril 15 mg qhs for sleep in PLACE of Xanax.  -Discussed sleep hygiene at length as well.

## 2014-07-29 NOTE — Assessment & Plan Note (Signed)
Mild erythema of LEFT ear canal on exam, tenderness to palpation of outer ear. Right ear with no abnormalities. Suspect patient has otitis externa of the left ear. No effusion or bulging of the tympanic membrane.  -Give Corticosporin ear drops to use qid for 7 days.

## 2014-08-02 NOTE — Progress Notes (Signed)
Case discussed with Dr. Jones at time of visit.  We reviewed the resident's history and exam and pertinent patient test results.  I agree with the assessment, diagnosis, and plan of care documented in the resident's note. 

## 2014-09-29 ENCOUNTER — Ambulatory Visit: Payer: Medicaid Other | Admitting: Internal Medicine

## 2015-12-14 ENCOUNTER — Other Ambulatory Visit: Payer: Self-pay | Admitting: Internal Medicine

## 2015-12-23 ENCOUNTER — Ambulatory Visit (INDEPENDENT_AMBULATORY_CARE_PROVIDER_SITE_OTHER): Payer: Medicaid Other | Admitting: Internal Medicine

## 2015-12-23 ENCOUNTER — Other Ambulatory Visit (HOSPITAL_COMMUNITY)
Admission: RE | Admit: 2015-12-23 | Discharge: 2015-12-23 | Disposition: A | Discharge status: Home / Self Care | Payer: Medicaid Other | Source: Ambulatory Visit | Attending: Internal Medicine | Admitting: Internal Medicine

## 2015-12-23 ENCOUNTER — Encounter: Payer: Self-pay | Admitting: Internal Medicine

## 2015-12-23 VITALS — BP 124/71 | HR 83 | Temp 98.5°F | Ht 63.0 in | Wt 151.8 lb

## 2015-12-23 DIAGNOSIS — L301 Dyshidrosis [pompholyx]: Secondary | ICD-10-CM

## 2015-12-23 DIAGNOSIS — Z01419 Encounter for gynecological examination (general) (routine) without abnormal findings: Secondary | ICD-10-CM | POA: Diagnosis present

## 2015-12-23 DIAGNOSIS — Z124 Encounter for screening for malignant neoplasm of cervix: Secondary | ICD-10-CM | POA: Diagnosis not present

## 2015-12-23 DIAGNOSIS — R87810 Cervical high risk human papillomavirus (HPV) DNA test positive: Secondary | ICD-10-CM | POA: Insufficient documentation

## 2015-12-23 DIAGNOSIS — Z1151 Encounter for screening for human papillomavirus (HPV): Secondary | ICD-10-CM | POA: Diagnosis present

## 2015-12-23 MED ORDER — CLOBETASOL PROPIONATE 0.05 % EX CREA: 1.0000 "application " | TOPICAL_CREAM | Freq: Every day | CUTANEOUS | Status: DC | PRN

## 2015-12-23 NOTE — Patient Instructions (Signed)
Eczema  TREATMENT  Eczema cannot be cured, but symptoms usually can be controlled with treatment and other strategies. A treatment plan might include:  Controlling the itching and scratching.   Use over-the-counter antihistamines as directed for itching. This is especially useful at night when the itching tends to be worse.   Use over-the-counter steroid creams as directed for itching.   Avoid scratching. Scratching makes the rash and itching worse. It may also result in a skin infection (impetigo) due to a break in the skin caused by scratching.   Keeping the skin well moisturized with creams every day. This will seal in moisture and help prevent dryness. Lotions that contain alcohol and water should be avoided because they can dry the skin.   Limiting exposure to things that you are sensitive or allergic to (allergens).   Recognizing situations that cause stress.   Developing a plan to manage stress.    HOME CARE INSTRUCTIONS   Only take over-the-counter or prescription medicines as directed by your health care provider.   Do not use anything on the skin without checking with your health care provider.   Keep baths or showers short (5 minutes) in warm (not hot) water. Use mild cleansers for bathing. These should be unscented. You may add nonperfumed bath oil to the bath water. It is best to avoid soap and bubble bath.   Immediately after a bath or shower, when the skin is still damp, apply a moisturizing ointment to the entire body. This ointment should be a petroleum ointment. This will seal in moisture and help prevent dryness. The thicker the ointment, the better. These should be unscented.   Keep fingernails cut short. Children with eczema may need to wear soft gloves or mittens at night after applying an ointment.   Dress in clothes made of cotton or cotton blends. Dress lightly, because heat increases itching.   A child with eczema should stay away from  anyone with fever blisters or cold sores. The virus that causes fever blisters (herpes simplex) can cause a serious skin infection in children with eczema.   SEEK MEDICAL CARE IF:   Your itching interferes with sleep.   Your rash gets worse or is not better within 1 week after starting treatment.   You see pus or soft yellow scabs in the rash area.   You have a fever.   You have a rash flare-up after contact with someone who has fever blisters.

## 2015-12-23 NOTE — Progress Notes (Signed)
Internal Medicine Clinic Attending  Case discussed with Dr. Patel,Rushil at the time of the visit.  We reviewed the resident's history and exam and pertinent patient test results.  I agree with the assessment, diagnosis, and plan of care documented in the resident's note.  

## 2015-12-23 NOTE — Assessment & Plan Note (Signed)
Overview She notes the acute onset of rash for which she would like to have her clobetasol cream refilled. She complains that the rash is itchy and used to affect her arms and legs though is now appearing on her right foot. She is confused as to why this condition persists. She denies any history of asthma or allergies as noted by symptoms of diffuse pruritus or shortness of breath. She does not smoke cigarettes.  Assessment Dyshidrotic eczema as suggested by physical exam findings.  Plan -Prescribed clobetasol 0.05% cream with daily application as needed -Counseled patient on this condition and explained to her that it is not curable but just requires ongoing maintenance with moisturizers, shorter time in warm showers

## 2015-12-23 NOTE — Progress Notes (Signed)
   Subjective:    Patient ID: [name], female    DOB: [birth year], 40 y.o.   MRN: [id number]  HPI [name] is a 40 year old female with depression/anxiety, insomnia who presents today for eczema. Please see assessment & plan for status of chronic medical problems.  The interval since her last visit, her mood and sleep symptoms have markedly improved to the point where she is no longer requiring these medications, so I have discontinued them from her medication list [temazepam, sertraline].  Review of Systems  Respiratory: Negative for shortness of breath.   Genitourinary: Negative for dysuria, vaginal discharge and menstrual problem.  Skin: Positive for color change and rash. Negative for wound.  Allergic/Immunologic: Negative for environmental allergies and food allergies.       Objective:   Physical Exam  Constitutional: She is oriented to person, place, and time. She appears well-developed and well-nourished. No distress.  HENT:  Head: Normocephalic and atraumatic.  Eyes: Conjunctivae are normal. No scleral icterus.  Neck: No tracheal deviation present.  Cardiovascular: Normal rate and regular rhythm.   Pulmonary/Chest: Effort normal and breath sounds normal. No stridor. No respiratory distress. She has no wheezes.  Genitourinary:  Cervical vault notable for milky, white discharge without foul order or purulent appearance.  Neurological: She is alert and oriented to person, place, and time.  Skin: Skin is warm and dry. Rash noted. She is not diaphoretic.  Right foot notable for dry, hyperpigmented lesion spanning the medial malleolus to the lateral surface          Assessment & Plan:

## 2015-12-23 NOTE — Assessment & Plan Note (Addendum)
Overview She is overdue for Pap smear today and is agreeable to having it done. She denies any vaginal discharge, dysuria, irregular menses, prior history of abnormal Pap smear.  Assessment At risk for cervical cancer and eligible for screening  Plan Performed Pap smear today and told the patient that I would call her for any abnormal results  ADDENDUM 12/29/2015  12:13 PM:  Pap smear notable for trichomonas and HPV positivity though no signs of neoplasia. Plan to call the patient to discuss these results and whether she would like to repeat in 1 year or be referred to gynecology per ASCCP guidelines per her risk factors.

## 2015-12-25 LAB — CYTOLOGY - PAP

## 2015-12-29 ENCOUNTER — Telehealth: Payer: Self-pay | Admitting: Internal Medicine

## 2015-12-29 ENCOUNTER — Other Ambulatory Visit (HOSPITAL_COMMUNITY)
Admission: RE | Admit: 2015-12-29 | Discharge: 2015-12-29 | Disposition: A | Discharge status: Home / Self Care | Payer: Medicaid Other | Source: Ambulatory Visit | Attending: Internal Medicine | Admitting: Internal Medicine

## 2015-12-29 DIAGNOSIS — Z124 Encounter for screening for malignant neoplasm of cervix: Secondary | ICD-10-CM

## 2015-12-29 DIAGNOSIS — Z113 Encounter for screening for infections with a predominantly sexual mode of transmission: Secondary | ICD-10-CM | POA: Diagnosis present

## 2015-12-29 MED ORDER — METRONIDAZOLE 500 MG PO TABS: 2000.0000 mg | ORAL_TABLET | Freq: Once | ORAL | Status: DC

## 2015-12-29 NOTE — Telephone Encounter (Signed)
  Reason for call:   I placed an outgoing call to [name] at 1145  AM regarding pap smear results.   Assessment/ Plan:   I told her she had trichomonas. She would need to be treated along with her partners. She reports an intolerance to tablets and would like to have the lowest number of pills possible, so I will prescribe 500mg  x 4 tablets for one-time dose.  She is wondering if she should let her female partner know to which I said yes given that it's a sexually transmitted infection. I am unsure if we can write for his prescription as well and will let her know as soon as I find out.  She is also wondering if she had GC/Chlamydia as she has had in the past though we did not check her for it at her last visit in the absence of symptoms suggestive of active disease, like discharge, pain with intercourse.  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 In terms of risk factors, she has had 2 sexual partners in the past year but no family history of cervical cancer or abnormal Pap smears.   She would like to be tested for GC/Chlamydia, and we will let her know if she should come back to us for retesting or if she can just have it done at gynecology for when she follows up for her abnormal Pap smear.   [name] V, MD   12/29/2015, 11:46 AM

## 2015-12-29 NOTE — Telephone Encounter (Signed)
Spoke with patient around 1400. Instructed patient that medications had been filled and that 4 pills were for her and 4 were for her partner. Educated patient about spread of trichomonas after she asked "how I got it". [place] that she wanted to be seen to run the GC/chlamydia test and to get a referral for OBGYN. No current OBGYN. Scheduled appointment.

## 2015-12-29 NOTE — Telephone Encounter (Signed)
Spoke with patient again. She would like to come in tomorrow to be seen to discuss HPV more. She will also provide urine specimen for GC/Chlamydia and acknowledges understanding about trichomonas. I will refer her to gynecology then.

## 2015-12-30 ENCOUNTER — Ambulatory Visit (INDEPENDENT_AMBULATORY_CARE_PROVIDER_SITE_OTHER): Payer: Medicaid Other | Admitting: Internal Medicine

## 2015-12-30 ENCOUNTER — Encounter: Payer: Self-pay | Admitting: Internal Medicine

## 2015-12-30 VITALS — BP 138/78 | HR 93 | Temp 98.3°F | Ht 63.0 in | Wt 148.6 lb

## 2015-12-30 DIAGNOSIS — A599 Trichomoniasis, unspecified: Secondary | ICD-10-CM | POA: Diagnosis not present

## 2015-12-30 DIAGNOSIS — R87810 Cervical high risk human papillomavirus (HPV) DNA test positive: Secondary | ICD-10-CM | POA: Diagnosis not present

## 2015-12-30 MED ORDER — METRONIDAZOLE 500 MG PO TABS: 2000.0000 mg | ORAL_TABLET | Freq: Once | ORAL | Status: DC

## 2015-12-30 NOTE — Patient Instructions (Signed)
Someone from the clinic will call you in a couple days regarding the results of your other studies.  We will also refer your to the gynecologist for follow-up of your pap smear. If you do not hear from them in the next 1-2 weeks, please give us a call back.  Please return back to the clinic if you have any additional symptoms.

## 2015-12-30 NOTE — Progress Notes (Signed)
Internal Medicine Clinic Attending  Case discussed with Dr. Patel,Rushil at the time of the visit.  We reviewed the resident's history and exam and pertinent patient test results.  I agree with the assessment, diagnosis, and plan of care documented in the resident's note.  

## 2015-12-30 NOTE — Progress Notes (Addendum)
   Subjective:    Patient ID: [name], female    DOB: Aug 23, 1975, 40 y.o.   MRN: [id number]  HPI [name] is a 40 year old female with depression/anxiety, insomnia who presents today for review of Pap result. Please see assessment & plan for status of chronic medical problems.   Review of Systems  Genitourinary: Positive for vaginal bleeding. Negative for dysuria, frequency, vaginal discharge, genital sores, vaginal pain, menstrual problem, pelvic pain and dyspareunia.       Objective:   Physical Exam  Constitutional: She is oriented to person, place, and time. She appears well-developed and well-nourished. No distress.  HENT:  Head: Normocephalic and atraumatic.  Eyes: Conjunctivae are normal. No scleral icterus.  Neck: No tracheal deviation present.  Cardiovascular: Normal rate and regular rhythm.  Exam reveals no gallop and no friction rub.   No murmur heard. Pulmonary/Chest: Effort normal. No stridor. No respiratory distress.  Neurological: She is alert and oriented to person, place, and time.  Skin: Skin is warm and dry. She is not diaphoretic.          Assessment & Plan:

## 2015-12-30 NOTE — Assessment & Plan Note (Addendum)
Overview She was very concerned about the results I disclosed to her over the phone yesterday and wanted to meet me in person today. I explained to her HPV is a virus that the majority of the population will be infected with given how ubiquitous it is. Per review of prior Pap smears, I explained to her that HPV testing was not done as it has been a recent addition based off of research published in the last 5-10 years. She acknowledged that she did not always use protection during intercourse and that she does have currently 2 partners, one of which she is sexually active with currently.  Assessment Normal Pap smear with positive HPV test done no prior history of abnormal Pap smears or family history of cervical cancer. She is at high risk given her multiple partners and prior history of GC chlamydia infection for STI's.  Plan -Provided patient with one page handout from the CDC on HPV and its implications. Counseled the patient on safe sexual intercourse. -Check HIV, RPR, GC/chlamydia -Refer to gynecology for possible colposcopy  ADDENDUM 12/31/2015  8:33 AM:  HIV, RPR, GC/Chlamydia are reassuring. Conveyed results to patient by phone.

## 2015-12-30 NOTE — Assessment & Plan Note (Signed)
Overview At her Pap smear last week, she tested positive for trichomonas. I explained to her that there is such a thing as an asymptomatic state for this infection though she is post last why her current partner denies having active infection. She reports that he has been seen by the doctor though had "no paperwork." She did pick up metronidazole 500 mg 4 tablets yesterday and took the medication without issue.  Assessment Asymptomatic trichomonas infection  Plan Prescribe metronidazole 500 mg 4 tablets for her partner and encouraged her to fill this prescription on behalf of her partner as she will get reinfected if he is not treated prior to the next episode of intercourse

## 2015-12-31 LAB — URINE CYTOLOGY ANCILLARY ONLY
Chlamydia: NEGATIVE
Neisseria Gonorrhea: NEGATIVE

## 2015-12-31 LAB — RPR: RPR Ser Ql: NONREACTIVE

## 2015-12-31 LAB — HIV ANTIBODY (ROUTINE TESTING W REFLEX): HIV Screen 4th Generation wRfx: NONREACTIVE

## 2016-01-23 ENCOUNTER — Telehealth: Payer: Self-pay | Admitting: Internal Medicine

## 2016-01-23 NOTE — Telephone Encounter (Signed)
Please call the patient and let her know that the gynecologist reviewed her case and feel she just needs repeat her Pap smear 1 year in our clinic. If it is abnormal again, she will need further management at the gynecology office.  If she would like, I'm happy to see her back next May since I am familiar with her case.  Thank you.

## 2016-01-25 NOTE — Telephone Encounter (Signed)
Discussed with patient. Patient agreeable with this plan.

## 2016-02-01 ENCOUNTER — Encounter: Payer: Self-pay | Admitting: *Deleted

## 2016-02-11 ENCOUNTER — Ambulatory Visit (INDEPENDENT_AMBULATORY_CARE_PROVIDER_SITE_OTHER): Payer: Medicaid Other | Admitting: Internal Medicine

## 2016-02-11 ENCOUNTER — Encounter: Payer: Self-pay | Admitting: Internal Medicine

## 2016-02-11 DIAGNOSIS — R8299 Other abnormal findings in urine: Secondary | ICD-10-CM | POA: Diagnosis not present

## 2016-02-11 DIAGNOSIS — N390 Urinary tract infection, site not specified: Secondary | ICD-10-CM | POA: Insufficient documentation

## 2016-02-11 DIAGNOSIS — Z8742 Personal history of other diseases of the female genital tract: Secondary | ICD-10-CM | POA: Diagnosis not present

## 2016-02-11 DIAGNOSIS — A599 Trichomoniasis, unspecified: Secondary | ICD-10-CM

## 2016-02-11 DIAGNOSIS — R350 Frequency of micturition: Secondary | ICD-10-CM | POA: Diagnosis present

## 2016-02-11 DIAGNOSIS — R319 Hematuria, unspecified: Secondary | ICD-10-CM | POA: Diagnosis not present

## 2016-02-11 LAB — POCT URINALYSIS DIPSTICK
Bilirubin, UA: NEGATIVE
Glucose, UA: NEGATIVE
KETONES UA: NEGATIVE
NITRITE UA: NEGATIVE
PROTEIN UA: NEGATIVE
Spec Grav, UA: >=1.03
UROBILINOGEN UA: 0.2
pH, UA: 6

## 2016-02-11 MED ORDER — METRONIDAZOLE 500 MG PO TABS: 2000.0000 mg | ORAL_TABLET | Freq: Once | ORAL | Status: DC

## 2016-02-11 NOTE — Patient Instructions (Signed)
Your symptoms are suggestive of recurrent trichomonas infection.  Please take 4 tablets yourself and ask you partner to take 4 tablets of metronidazole.  If your symptoms don't improve, please call us back.

## 2016-02-11 NOTE — Progress Notes (Signed)
   Subjective:    Patient ID: [name], female    DOB: 1975/10/24, 40 y.o.   MRN: [id number]  HPI  40 yo with hx of trichomonas infection in the past, eczema, presents with urinary complaints.   1 month ago Pap smear notable for trichomonas and HPV positivity  She was given 500mg  x4 tabls of metronidazole for her partner and also for her self in the past for trichomonas infection.   Comes in with complaint of bladder spasm and urinary frequency for last 1-2 months. She feels some spasm in her 'pee hole" Sometimes even has accident because she can't make it to the bathroom. No vaginal discharge, no burning or pain with urination. No blood or dark urine. No abdominal pain, fever, chills, n/v. States that she and her partner both took the metronizadole for the trich infection in the past.  Urine dipstick: small blood and leuk, otherwise negative.    Review of Systems  Constitutional: Negative for fever and chills.  HENT: Negative for congestion and sore throat.   Respiratory: Negative for chest tightness and shortness of breath.   Cardiovascular: Negative for chest pain, palpitations and leg swelling.  Gastrointestinal: Negative for abdominal pain and abdominal distention.  Genitourinary: Positive for urgency and frequency. Negative for dysuria, flank pain, vaginal bleeding, vaginal discharge and vaginal pain.  Neurological: Negative for dizziness and numbness.       Objective:   Physical Exam  Constitutional: She is oriented to person, place, and time. She appears well-developed and well-nourished. No distress.  HENT:  Head: Normocephalic and atraumatic.  Eyes: Conjunctivae are normal. Right eye exhibits no discharge. Left eye exhibits no discharge. No scleral icterus.  Neck: Normal range of motion.  Cardiovascular: Normal rate, regular rhythm, S1 normal, S2 normal and normal heart sounds.  Exam reveals no gallop and no friction rub.   No murmur heard. Pulmonary/Chest: Effort  normal and breath sounds normal. No respiratory distress. She has no wheezes. She has no rales. She exhibits no tenderness.  Abdominal: Soft. Bowel sounds are normal. She exhibits no distension. There is no tenderness.  Musculoskeletal: Normal range of motion. She exhibits no edema or tenderness.  Neurological: She is alert and oriented to person, place, and time. She has normal strength and normal reflexes. No cranial nerve deficit or sensory deficit.  Skin: She is not diaphoretic.  Psychiatric: She has a normal mood and affect.    Filed Vitals:   02/11/16 0952 02/11/16 0953  BP:  129/72  Pulse:  74  Temp: 98.2 F (36.8 C)          Assessment & Plan:  See problem based a&p.

## 2016-02-11 NOTE — Assessment & Plan Note (Signed)
Having urinary frequency, some possible urethritis/vaginits, bladder spasm. No CVA tenderness, abdominal tenderness or other findings on exam. Afebrile. Urine dipstick negative other than small blood and small leuks.  This is likely recurrence of her trichomonas infection al though she states she did take her metronizadole 2mg  and her partner also took it.  We will treat her and her partner again with 2 g of metronidazole. If symptoms don't improve, then asked her to follow up as needed.

## 2016-02-14 NOTE — Progress Notes (Signed)
Medicine attending: Medical history, presenting problems, physical findings, and medications, reviewed with resident physician Dr Tasrif Ahmed on the day of the patient visit and I concur with his evaluation and management plan. 

## 2016-05-29 ENCOUNTER — Other Ambulatory Visit: Payer: Self-pay | Admitting: Internal Medicine

## 2016-05-29 DIAGNOSIS — L301 Dyshidrosis [pompholyx]: Secondary | ICD-10-CM

## 2016-06-12 ENCOUNTER — Other Ambulatory Visit: Payer: Self-pay | Admitting: Internal Medicine

## 2016-06-12 DIAGNOSIS — L301 Dyshidrosis [pompholyx]: Secondary | ICD-10-CM

## 2016-06-22 ENCOUNTER — Ambulatory Visit: Payer: Medicaid Other

## 2016-06-22 ENCOUNTER — Encounter: Payer: Self-pay | Admitting: Internal Medicine

## 2017-01-11 ENCOUNTER — Ambulatory Visit: Payer: Medicaid Other

## 2017-01-11 ENCOUNTER — Encounter: Payer: Self-pay | Admitting: Internal Medicine

## 2017-01-12 ENCOUNTER — Ambulatory Visit (INDEPENDENT_AMBULATORY_CARE_PROVIDER_SITE_OTHER): Payer: Medicaid Other | Admitting: Internal Medicine

## 2017-01-12 ENCOUNTER — Encounter: Payer: Self-pay | Admitting: Internal Medicine

## 2017-01-12 VITALS — BP 139/82 | HR 87 | Temp 98.4°F | Ht 64.0 in | Wt 143.9 lb

## 2017-01-12 DIAGNOSIS — F329 Major depressive disorder, single episode, unspecified: Secondary | ICD-10-CM

## 2017-01-12 DIAGNOSIS — G43019 Migraine without aura, intractable, without status migrainosus: Secondary | ICD-10-CM

## 2017-01-12 DIAGNOSIS — F418 Other specified anxiety disorders: Secondary | ICD-10-CM

## 2017-01-12 DIAGNOSIS — F32A Depression, unspecified: Secondary | ICD-10-CM | POA: Insufficient documentation

## 2017-01-12 DIAGNOSIS — G43909 Migraine, unspecified, not intractable, without status migrainosus: Secondary | ICD-10-CM | POA: Diagnosis not present

## 2017-01-12 DIAGNOSIS — L301 Dyshidrosis [pompholyx]: Secondary | ICD-10-CM

## 2017-01-12 DIAGNOSIS — F419 Anxiety disorder, unspecified: Principal | ICD-10-CM

## 2017-01-12 HISTORY — DX: Migraine, unspecified, not intractable, without status migrainosus: G43.909

## 2017-01-12 MED ORDER — SUMATRIPTAN SUCCINATE 50 MG PO TABS: 50.0000 mg | ORAL_TABLET | ORAL | 0 refills | Status: DC | PRN

## 2017-01-12 MED ORDER — CLOTRIMAZOLE 1 % EX CREA: 1.0000 "application " | TOPICAL_CREAM | Freq: Two times a day (BID) | CUTANEOUS | 1 refills | Status: DC

## 2017-01-12 MED ORDER — SERTRALINE HCL 50 MG PO TABS: ORAL_TABLET | ORAL | 0 refills | Status: DC

## 2017-01-12 MED ORDER — ALPRAZOLAM 0.25 MG PO TABS: 0.2500 mg | ORAL_TABLET | Freq: Every evening | ORAL | 0 refills | Status: DC | PRN

## 2017-01-12 NOTE — Progress Notes (Signed)
Case discussed with Dr. Patel soon after the resident saw the patient. We reviewed the resident's history and exam and pertinent patient test results. I agree with the assessment, diagnosis, and plan of care documented in the resident's note. 

## 2017-01-12 NOTE — Progress Notes (Signed)
   CC: depression and headache  HPI:  [name] is a 41 y.o. who presents today for depression and headache. Please see assessment & plan for status of chronic medical problems.   Past Medical History:  Diagnosis Date  . Acne vulgaris   . Depression   . Dyshidrotic eczema   . Gardnerella vaginitis   . Gonococcal cervicitis   . STD (female)     Review of Systems:  Please see each problem below for a pertinent review of systems.   Physical Exam:  Vitals:   01/12/17 0908  BP: 139/82  Pulse: 87  Temp: 98.4 F (36.9 C)  TempSrc: Oral  SpO2: 100%  Weight: 143 lb 14.4 oz (65.3 kg)  Height: 5\' 4"  (1.626 m)   Physical Exam  Constitutional: She is oriented to person, place, and time. No distress.  HENT:  Head: Normocephalic and atraumatic.  Eyes: Conjunctivae are normal. No scleral icterus.  Cardiovascular: Normal rate and normal heart sounds.   Pulmonary/Chest: Effort normal. No respiratory distress.  Neurological: She is alert and oriented to person, place, and time.  Skin: She is not diaphoretic.  Erythematous, hyperpigmented scaly lesion sparing the posterior aspect of her right foot  Psychiatric:  Intermittently tearful   Assessment & Plan:   See Encounters Tab for problem based charting.  Patient discussed with Dr. Josem KaufmannKlima

## 2017-01-12 NOTE — Assessment & Plan Note (Signed)
Assessment Over the last three days, she experienced onset of one of her typical throbbing headaches localized to the occipital area of her head. It's associated with nausea and makes her intensely sensitive to light and sound. She tried ibuprofen 200 mg x 3 and acetaminophen without relief.   I suspect her poor sleep and recent stressors are a source of an acute migraine and thus warrants abortive therapy.   Plan -Prescribed sumatriptan 50 mg x 10 tablets to be taken every 2 hours as needed for relief. Advised not to exceed 4 tablets [200 mg] in 24 hours.

## 2017-01-12 NOTE — Assessment & Plan Note (Signed)
Assessment Her skin lesion sparing the posterior surface of her right foot is no longer responding to the clobetasol and mosturizers. Her rash is itchy and recurs throughout the year without regard to season. She is agreeable to having a punch biopsy done to ensure it is truly eczema.  Plan -Check to see when we can schedule her for a punch biopsy and let her know  ADDENDUM 01/12/2017  4:49 PM:  After speaking with Dr. Josem KaufmannKlima, I favor a course antifungal therapy to see if it improves. I called her, and she is agreeable to this idea. I prescribed clotrimazole 1% cream to be applied twice daily as it is on the most recent Medicaid formulary.
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
## 2017-01-12 NOTE — Patient Instructions (Signed)
For your mood, start sertraline today. Take 1 tablet for 1 week and then go up to 2 tablets next week.  To help with sleep, we will write for a 30-day supply of Xanax.   For your headache, take 1 tablet every 2 hours until it resolves. Do NOT take more than 4 tablets in 1 day.

## 2017-01-13 ENCOUNTER — Telehealth: Payer: Self-pay | Admitting: *Deleted

## 2017-01-13 MED ORDER — SERTRALINE HCL 50 MG PO TABS: 50.0000 mg | ORAL_TABLET | Freq: Every day | ORAL | 0 refills | Status: DC

## 2017-01-13 NOTE — Telephone Encounter (Signed)
Pt called - no one answered; left message new rx for Sertraline 50 mg sent to the pharmacy;take 1 tab daily x 1 week and if tolerates, call us back then doctor can increase to 100 mg.

## 2017-01-13 NOTE — Addendum Note (Signed)
Addended by: Beather ArbourPATEL, RUSHIL V on: 01/13/2017 03:20 PM   Modules accepted: Orders

## 2017-01-13 NOTE — Telephone Encounter (Signed)
Let's do a 1 week supply only of the 50 mg tablet. If she tolerates it well, we can switch her to the 100 mg tablets thereafter. She can call us back after 7 days and does not need to be seen unless she wishes.

## 2017-01-13 NOTE — Telephone Encounter (Signed)
Pt called back; informed of Dr Eliane DecreePatel's response, stated ok.

## 2017-01-13 NOTE — Telephone Encounter (Signed)
Fax from [facility] - states pt's insurance will only cover 1 tab / day. Thanks

## 2017-01-27 ENCOUNTER — Encounter: Payer: Self-pay | Admitting: *Deleted

## 2017-02-07 ENCOUNTER — Encounter: Payer: Medicaid Other | Admitting: Internal Medicine

## 2017-02-08 ENCOUNTER — Ambulatory Visit (INDEPENDENT_AMBULATORY_CARE_PROVIDER_SITE_OTHER): Payer: Medicaid Other | Admitting: Internal Medicine

## 2017-02-08 ENCOUNTER — Encounter: Payer: Self-pay | Admitting: Internal Medicine

## 2017-02-08 VITALS — BP 133/87 | HR 80 | Temp 98.4°F | Ht 64.0 in | Wt 148.1 lb

## 2017-02-08 DIAGNOSIS — F418 Other specified anxiety disorders: Secondary | ICD-10-CM

## 2017-02-08 DIAGNOSIS — F419 Anxiety disorder, unspecified: Principal | ICD-10-CM

## 2017-02-08 DIAGNOSIS — F329 Major depressive disorder, single episode, unspecified: Secondary | ICD-10-CM

## 2017-02-08 DIAGNOSIS — B353 Tinea pedis: Secondary | ICD-10-CM

## 2017-02-08 MED ORDER — SERTRALINE HCL 100 MG PO TABS: 100.0000 mg | ORAL_TABLET | Freq: Every day | ORAL | 1 refills | Status: DC

## 2017-02-08 MED ORDER — CLOTRIMAZOLE 1 % EX CREA: 1.0000 "application " | TOPICAL_CREAM | Freq: Two times a day (BID) | CUTANEOUS | 1 refills | Status: DC

## 2017-02-08 MED ORDER — ALPRAZOLAM 0.25 MG PO TABS: 0.2500 mg | ORAL_TABLET | Freq: Every evening | ORAL | 0 refills | Status: AC | PRN

## 2017-02-08 NOTE — Patient Instructions (Signed)
For your mood, let's take sertraline 100 mg during the daily alongside alprazolam at bedtime.  For the skin rash, continue taking the antifungal cream.

## 2017-02-09 ENCOUNTER — Encounter: Payer: Medicaid Other | Admitting: Internal Medicine

## 2017-02-09 NOTE — Assessment & Plan Note (Addendum)
Assessment Her PHQ 9 is 3 today, down from 17 and GAD 7 is 9 today, down from 17 - both back in May. Though I had prescribed 50 mg with instructions to take 1 tablet x 7 days and then 2 tablets thereafter, her insurance didn't cover this prescription, so I opted to send 50 mg x 7 days and wait for her to call in for a refill for 100 mg. She unfortunately did not call back to refill and has been only taking alprazolam 0.25 mg at bedtime as needed instead. She denies any suicidal ideation though would like to continue sertraline alongside the alprazolam with sleep.  Plan -Refill sertraline 100 mg at bedtime with alprazolam 0.25 mg at bedtime x 30 tablets. Emphasized that alprazolam will not be refilled as the sertraline is meant to give her long term control of symptoms. -Should she continue to have significant sleeping difficulties, trazodone can be considered an adjunctive therapy at follow-up.

## 2017-02-09 NOTE — Progress Notes (Signed)
   CC: depression  HPI:  [name] is a 41 y.o. who presents today for depression. Please see assessment & plan for status of chronic medical problems.   Past Medical History:  Diagnosis Date  . Acne vulgaris   . Depression   . Dyshidrotic eczema   . Gardnerella vaginitis   . Gonococcal cervicitis   . STD (female)     Review of Systems:  Please see each problem below for a pertinent review of systems.  Physical Exam:  Vitals:   02/08/17 1109  BP: 133/87  Pulse: 80  Temp: 98.4 F (36.9 C)  TempSrc: Oral  SpO2: 100%  Weight: 148 lb 1.6 oz (67.2 kg)  Height: 5\' 4"  (1.626 m)   Physical Exam  Constitutional: She is oriented to person, place, and time. No distress.  HENT:  Head: Normocephalic and atraumatic.  Eyes: Conjunctivae are normal. No scleral icterus.  Pulmonary/Chest: Effort normal. No respiratory distress.  Neurological: She is alert and oriented to person, place, and time.  Skin: She is not diaphoretic.  Right foot with interval improvement in the hyperpigmented lesion sparing the Achilles tendon of the posterior surface.     Assessment Her skin lesion has improved with clotrimazole 2-3 times daily, and she would like a refill.   Plan -Refilled clotrimazole     Assessment & Plan:   See Encounters Tab for problem based charting.  Patient discussed with Dr. Oswaldo DoneVincent

## 2017-02-09 NOTE — Progress Notes (Signed)
Internal Medicine Clinic Attending  Case discussed with Dr. Patel,Rushil at the time of the visit.  We reviewed the resident's history and exam and pertinent patient test results.  I agree with the assessment, diagnosis, and plan of care documented in the resident's note.  

## 2017-03-20 ENCOUNTER — Ambulatory Visit (INDEPENDENT_AMBULATORY_CARE_PROVIDER_SITE_OTHER): Payer: Medicaid Other | Admitting: Internal Medicine

## 2017-03-20 ENCOUNTER — Other Ambulatory Visit: Payer: Self-pay | Admitting: *Deleted

## 2017-03-20 VITALS — BP 142/89 | HR 72 | Temp 98.2°F | Ht 64.0 in | Wt 147.7 lb

## 2017-03-20 DIAGNOSIS — Z114 Encounter for screening for human immunodeficiency virus [HIV]: Secondary | ICD-10-CM

## 2017-03-20 DIAGNOSIS — B353 Tinea pedis: Secondary | ICD-10-CM

## 2017-03-20 DIAGNOSIS — F329 Major depressive disorder, single episode, unspecified: Secondary | ICD-10-CM

## 2017-03-20 DIAGNOSIS — F419 Anxiety disorder, unspecified: Principal | ICD-10-CM

## 2017-03-20 DIAGNOSIS — Z7251 High risk heterosexual behavior: Secondary | ICD-10-CM

## 2017-03-20 DIAGNOSIS — F32A Depression, unspecified: Secondary | ICD-10-CM

## 2017-03-20 MED ORDER — TRAZODONE HCL 50 MG PO TABS: 50.0000 mg | ORAL_TABLET | Freq: Every day | ORAL | 3 refills | Status: DC

## 2017-03-20 MED ORDER — SERTRALINE HCL 100 MG PO TABS: 100.0000 mg | ORAL_TABLET | Freq: Every day | ORAL | 1 refills | Status: DC

## 2017-03-20 MED ORDER — CLOTRIMAZOLE 1 % EX CREA: 1.0000 "application " | TOPICAL_CREAM | Freq: Two times a day (BID) | CUTANEOUS | 1 refills | Status: DC

## 2017-03-20 NOTE — Progress Notes (Signed)
Internal Medicine Clinic Attending  I saw and evaluated the patient.  I personally confirmed the key portions of the history and exam documented by Dr. Harden and I reviewed pertinent patient test results.  The assessment, diagnosis, and plan were formulated together and I agree with the documentation in the resident's note.  

## 2017-03-20 NOTE — Patient Instructions (Signed)
Thank you for coming in today [name].  We refilled some of her medications including Zoloft, take this daily in the morning. We also prescribed a new medicine called trazodone and you'll take 50 mg at bedtime to help sleep. We'll have you come back to the clinic in about 4 weeks to see you are doing   We're getting blood work to check if you have an HIV infection.
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
## 2017-03-20 NOTE — Assessment & Plan Note (Signed)
At her last visit in June her PHQ-9 was 3, has increased to 16 today. Given her previous control her regimen of sertraline 100 mg daily is likely effective, but she has had increased stress over the last 2-3 weeks. The patient also feels her regimen is beneficial and this setback is consistent with her experiences of increased stress in the past. Sleep is her main concern of her symptoms, will transition from alprazolam for sleep to trazodone consistent with her previous PCP plan. She declines a psychiatry or psychology referral at this time.  -continue sertraline 100 mg daily -Start trazodone 50 mg daily at bedtime -Follow-up in 4 weeks to assess response
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
## 2017-03-20 NOTE — Assessment & Plan Note (Addendum)
Patient reports improvement of the area of dry, discolored skin on right foot with topical clotrimazole. Will continue this treatment at this time. If the area does not resolve with continued topical tx and it is still felt to be a fungal etiology, may consider systemic therapy.

## 2017-03-21 LAB — HIV ANTIBODY (ROUTINE TESTING W REFLEX): HIV SCREEN 4TH GENERATION: NONREACTIVE

## 2017-06-13 ENCOUNTER — Other Ambulatory Visit: Payer: Self-pay | Admitting: Student in an Organized Health Care Education/Training Program

## 2017-06-13 DIAGNOSIS — B353 Tinea pedis: Secondary | ICD-10-CM

## 2017-08-02 ENCOUNTER — Other Ambulatory Visit: Payer: Self-pay | Admitting: Internal Medicine

## 2017-08-02 DIAGNOSIS — B353 Tinea pedis: Secondary | ICD-10-CM

## 2017-08-02 NOTE — Telephone Encounter (Signed)
Refill Request  clotrimazole (LOTRIMIN) 1 % cream

## 2017-08-04 MED ORDER — CLOTRIMAZOLE 1 % EX CREA: TOPICAL_CREAM | Freq: Two times a day (BID) | CUTANEOUS | 1 refills | Status: DC

## 2017-12-15 ENCOUNTER — Other Ambulatory Visit: Payer: Self-pay | Admitting: Internal Medicine

## 2017-12-15 DIAGNOSIS — B353 Tinea pedis: Secondary | ICD-10-CM

## 2017-12-15 NOTE — Telephone Encounter (Signed)
Patient is requesting refill on clotrimazole, [facility] on [address]

## 2017-12-16 MED ORDER — CLOTRIMAZOLE 1 % EX CREA: TOPICAL_CREAM | Freq: Two times a day (BID) | CUTANEOUS | 1 refills | Status: DC

## 2017-12-16 NOTE — Telephone Encounter (Signed)
refilled 

## 2018-01-29 ENCOUNTER — Encounter: Payer: Self-pay | Admitting: Internal Medicine

## 2018-02-26 ENCOUNTER — Other Ambulatory Visit: Payer: Self-pay | Admitting: *Deleted

## 2018-02-26 ENCOUNTER — Encounter: Payer: Medicaid Other | Admitting: Internal Medicine

## 2018-02-26 DIAGNOSIS — B353 Tinea pedis: Secondary | ICD-10-CM

## 2018-02-26 MED ORDER — CLOTRIMAZOLE 1 % EX CREA: TOPICAL_CREAM | Freq: Two times a day (BID) | CUTANEOUS | 1 refills | Status: DC

## 2018-02-26 NOTE — Telephone Encounter (Signed)
refilled 

## 2018-03-27 ENCOUNTER — Ambulatory Visit: Payer: Medicaid Other

## 2018-03-27 ENCOUNTER — Encounter: Payer: Self-pay | Admitting: Internal Medicine

## 2018-04-02 ENCOUNTER — Encounter: Payer: Medicaid Other | Admitting: Internal Medicine

## 2018-04-02 ENCOUNTER — Encounter: Payer: Self-pay | Admitting: Internal Medicine

## 2018-05-19 ENCOUNTER — Encounter (HOSPITAL_COMMUNITY): Payer: Self-pay | Admitting: *Deleted

## 2018-05-19 ENCOUNTER — Other Ambulatory Visit: Payer: Self-pay

## 2018-05-19 ENCOUNTER — Emergency Department (HOSPITAL_COMMUNITY)
Admission: EM | Admit: 2018-05-19 | Discharge: 2018-05-20 | Disposition: A | Discharge status: Home / Self Care | Payer: Medicaid Other | Attending: Emergency Medicine | Admitting: Emergency Medicine

## 2018-05-19 DIAGNOSIS — Z79899 Other long term (current) drug therapy: Secondary | ICD-10-CM | POA: Insufficient documentation

## 2018-05-19 DIAGNOSIS — R6883 Chills (without fever): Secondary | ICD-10-CM | POA: Insufficient documentation

## 2018-05-19 DIAGNOSIS — M545 Low back pain, unspecified: Secondary | ICD-10-CM

## 2018-05-19 DIAGNOSIS — N12 Tubulo-interstitial nephritis, not specified as acute or chronic: Secondary | ICD-10-CM | POA: Insufficient documentation

## 2018-05-19 LAB — POC URINE PREG, ED: Preg Test, Ur: NEGATIVE

## 2018-05-19 LAB — URINALYSIS, ROUTINE W REFLEX MICROSCOPIC
BACTERIA UA: NONE SEEN
BILIRUBIN URINE: NEGATIVE
Glucose, UA: NEGATIVE mg/dL
KETONES UR: NEGATIVE mg/dL
Nitrite: NEGATIVE
PROTEIN: NEGATIVE mg/dL
Specific Gravity, Urine: 1.02 (ref 1.005–1.030)
pH: 6 (ref 5.0–8.0)

## 2018-05-19 NOTE — ED Notes (Signed)
Pt brought to restroom in w/c and ambulated from hall to toilet without assist.

## 2018-05-19 NOTE — ED Triage Notes (Signed)
Pt reports lower middle back pain for about a week, worse today. Denies injury, no meds PTA. Pt says she thinks it may be related to a urinary tract infection because she has "had frequent urination for a month."

## 2018-05-20 ENCOUNTER — Emergency Department (HOSPITAL_COMMUNITY): Payer: Medicaid Other

## 2018-05-20 MED ORDER — METHOCARBAMOL 500 MG PO TABS: 500.0000 mg | ORAL_TABLET | Freq: Two times a day (BID) | ORAL | 0 refills | Status: DC

## 2018-05-20 MED ORDER — CEPHALEXIN 500 MG PO CAPS: 500.0000 mg | ORAL_CAPSULE | Freq: Three times a day (TID) | ORAL | 0 refills | Status: DC

## 2018-05-20 MED ORDER — CEPHALEXIN 500 MG PO CAPS
500.0000 mg | ORAL_CAPSULE | Freq: Once | ORAL | Status: AC
  Administered 2018-05-20: 500 mg via ORAL
  Filled 2018-05-20: qty 1

## 2018-05-20 MED ORDER — DIAZEPAM 5 MG PO TABS
5.0000 mg | ORAL_TABLET | Freq: Once | ORAL | Status: AC
  Administered 2018-05-20: 5 mg via ORAL
  Filled 2018-05-20: qty 1

## 2018-05-20 MED ORDER — ACETAMINOPHEN ER 650 MG PO TBCR: 650.0000 mg | EXTENDED_RELEASE_TABLET | Freq: Three times a day (TID) | ORAL | 0 refills | Status: DC | PRN

## 2018-05-20 MED ORDER — NAPROXEN 500 MG PO TABS
500.0000 mg | ORAL_TABLET | Freq: Once | ORAL | Status: AC
  Administered 2018-05-20: 500 mg via ORAL
  Filled 2018-05-20: qty 1

## 2018-05-20 MED ORDER — IBUPROFEN 400 MG PO TABS: 400.0000 mg | ORAL_TABLET | Freq: Four times a day (QID) | ORAL | 0 refills | Status: DC | PRN

## 2018-05-20 NOTE — Discharge Instructions (Signed)
We saw in the ER for the back pain. CT scan does not reveal any kidney stones, nor is there any signs of compression fracture to your spine. Given the urinary symptoms that you have been having, we suspect that there could be an element of pyelonephritis or kidney infection.  Please start taking the antibiotics as prescribed along with pain medicine. Please follow-up with primary care doctor in 1 to 2 weeks.  Return to the ER immediately if you start having worsening of your pain, inability to keep any medications down, new numbness or tingling in your legs or weakness in your legs or loss of bladder control.

## 2018-05-20 NOTE — ED Provider Notes (Signed)
[facility]-EMERGENCY DEPT Provider Note   CSN: [id number] Arrival date & time: 05/19/18  1934     History   Chief Complaint Chief Complaint  Patient presents with  . Back Pain    HPI [name] is a 42 y.o. female.  HPI   42 year old female with no significant medical history comes in with chief complaint of back pain. Patient reports that for about the last month she has been having urinary frequency.  She felt like she might have a UTI, but she decided to take care of it naturally.  2 weeks ago she started having back pain.  Pain feels " internal" and it is worse with movement.  There was no precipitating traumatic incident.  Today patient's pain got severe and she started having difficulty standing upright and walking.  Patient is able to walk, but she has to stop frequently because of pain.  Patient denies any nausea, vomiting -however she did have some chills yesterday.  She denies any similar history of pain in the past.  Pt has no associated numbness, weakness, urinary incontinence, urinary retention, bowel incontinence, pins and needle sensation in the perineal area.   Past Medical History:  Diagnosis Date  . Acne vulgaris   . Depression   . Dyshidrotic eczema   . Gardnerella vaginitis   . Gonococcal cervicitis   . STD (female)     Patient Active Problem List   Diagnosis Date Noted  . High risk heterosexual behavior 03/20/2017  . Anxiety and depression 01/12/2017  . Migraine 01/12/2017  . Papanicolaou smear of cervix with positive high risk human papilloma virus (HPV) test 12/23/2015  . Tinea pedis 01/22/2008    Past Surgical History:  Procedure Laterality Date  . TUBAL LIGATION  06/2004   bilateral     OB History   None      Home Medications    Prior to Admission medications   Medication Sig Start Date End Date Taking? Authorizing Provider  clotrimazole (LOTRIMIN) 1 % cream Apply 1 application topically 2 (two) times  daily. 05/09/18  Yes [provider]  acetaminophen (TYLENOL 8 HOUR) 650 MG CR tablet Take 1 tablet (650 mg total) by mouth every 8 (eight) hours as needed. 05/20/18   Derwood Kaplan, MD  cephALEXin (KEFLEX) 500 MG capsule Take 1 capsule (500 mg total) by mouth 3 (three) times daily. 05/20/18   Derwood Kaplan, MD  ibuprofen (ADVIL,MOTRIN) 400 MG tablet Take 1 tablet (400 mg total) by mouth every 6 (six) hours as needed. 05/20/18   Derwood Kaplan, MD  methocarbamol (ROBAXIN) 500 MG tablet Take 1 tablet (500 mg total) by mouth 2 (two) times daily. 05/20/18   Derwood Kaplan, MD  sertraline (ZOLOFT) 100 MG tablet Take 1 tablet (100 mg total) by mouth at bedtime. Patient not taking: Reported on 05/19/2018 03/20/17   Tyson Alias, MD  SUMAtriptan (IMITREX) 50 MG tablet Take 1 tablet (50 mg total) by mouth every 2 (two) hours as needed for migraine. May repeat in 2 hours if headache persists or recurs but no more than 4 tablets in 1 day. Patient not taking: Reported on 05/19/2018 01/12/17   Beather Arbour, MD  traZODone (DESYREL) 50 MG tablet Take 1 tablet (50 mg total) by mouth at bedtime. Patient not taking: Reported on 05/19/2018 03/20/17   Tyson Alias, MD    Family History Family History  Problem Relation Age of Onset  . Stroke Mother  age 67 when she had the stroke  . Hyperlipidemia Father     Social History Social History   Tobacco Use  . Smoking status: Never Smoker  . Smokeless tobacco: Never Used  Substance Use Topics  . Alcohol use: No    Alcohol/week: 0.0 standard drinks  . Drug use: Never     Allergies   Patient has no known allergies.   Review of Systems Review of Systems  Constitutional: Positive for activity change and chills. Negative for fever.  Cardiovascular: Negative for chest pain.  Gastrointestinal: Negative for abdominal pain.  Genitourinary: Positive for frequency.  Musculoskeletal: Positive for back pain.    Allergic/Immunologic: Negative for immunocompromised state.  Hematological: Does not bruise/bleed easily.  All other systems reviewed and are negative.    Physical Exam Updated Vital Signs BP (!) 135/91   Pulse 78   Temp 98.8 F (37.1 C) (Oral)   Resp 16   LMP 04/18/2018   SpO2 97%   Physical Exam  Constitutional: She is oriented to person, place, and time. She appears well-developed.  HENT:  Head: Normocephalic and atraumatic.  Eyes: EOM are normal.  Neck: Normal range of motion. Neck supple.  Cardiovascular: Normal rate.  Pulmonary/Chest: Effort normal.  Abdominal: Bowel sounds are normal.  Musculoskeletal:  Pt has no reproducible tenderness over the lumbar region or paraspinal region. No step offs, no erythema. Pt has 1+ patellar reflex bilaterally. Able to discriminate between sharp and dull. Able to ambulate   Neurological: She is alert and oriented to person, place, and time.  Skin: Skin is warm and dry.  Nursing note and vitals reviewed.    ED Treatments / Results  Labs (all labs ordered are listed, but only abnormal results are displayed) Labs Reviewed  URINALYSIS, ROUTINE W REFLEX MICROSCOPIC - Abnormal; Notable for the following components:      Result Value   Hgb urine dipstick SMALL (*)    Leukocytes, UA LARGE (*)    All other components within normal limits  POC URINE PREG, ED    EKG None  Radiology Ct Renal Stone Study  Result Date: 05/20/2018 CLINICAL DATA:  Abdominal and flank pain.  Assess for kidney stone. EXAM: CT ABDOMEN AND PELVIS WITHOUT CONTRAST TECHNIQUE: Multidetector CT imaging of the abdomen and pelvis was performed following the standard protocol without IV contrast. COMPARISON:  None. FINDINGS: LOWER CHEST: Bibasilar atelectasis/scarring. The visualized heart size is normal. No pericardial effusion. HEPATOBILIARY: Normal. PANCREAS: Normal. SPLEEN: Normal. ADRENALS/URINARY TRACT: Kidneys are orthotopic, demonstrating normal size  and morphology. No nephrolithiasis, hydronephrosis; limited assessment for renal masses by nonenhanced CT. The unopacified ureters are normal in course and caliber. Urinary bladder is partially distended and unremarkable. Normal adrenal glands. STOMACH/BOWEL: The stomach, small and large bowel are normal in course and caliber without inflammatory changes, sensitivity decreased by lack of enteric contrast. Normal appendix. VASCULAR/LYMPHATIC: Aortoiliac vessels are normal in course and caliber. No lymphadenopathy by CT size criteria. REPRODUCTIVE: Normal. Migrated LEFT tubal ligation clip in RIGHT pelvis. OTHER: No intraperitoneal free fluid or free air. MUSCULOSKELETAL: Non-acute. Small fat containing supraumbilical ventral hernia. Small RIGHT central L5-S1 disc protrusion. IMPRESSION: 1. No nephrolithiasis, hydronephrosis or acute intra-abdominal/pelvic process. 2. Migrated LEFT tubal ligation clip. Electronically Signed   By: Awilda Metro M.D.   On: 05/20/2018 00:33    Procedures Procedures (including critical care time)  Medications Ordered in ED Medications  naproxen (NAPROSYN) tablet 500 mg (500 mg Oral Given 05/20/18 0040)  diazepam (VALIUM) tablet 5 mg (  5 mg Oral Given 05/20/18 0040)  cephALEXin (KEFLEX) capsule 500 mg (500 mg Oral Given 05/20/18 0043)     Initial Impression / Assessment and Plan / ED Course  I have reviewed the triage vital signs and the nursing notes.  Pertinent labs & imaging results that were available during my care of the patient were reviewed by me and considered in my medical decision making (see chart for details).     42 year old female comes in with chief complaint of back pain.  Patient is not having any abdominal or pelvic pain.  She has been having urinary frequency for the past several days, and thereafter she has started having back pain.  Patient is pointing to her left flank for the location of her pain.  On exam she does not have any reproducible  tenderness to palpation.  Abdominal exam is also benign.  It is not very clear on exam that patient has musculoskeletal etiology for the pain.  There is an element of cystitis based on history, and so there is concerns for possibly a pyelonephritis type etiology causing the pain.   CT renal stone has been ordered.  12:52 AM Results from the ER workup discussed with the patient face to face and all questions answered to the best of my ability. Strict ER return precautions have been discussed, and patient is agreeing with the plan and is comfortable with the workup done and the recommendations from the ER.   Final Clinical Impressions(s) / ED Diagnoses   Final diagnoses:  Pyelonephritis  Acute bilateral low back pain without sciatica    ED Discharge Orders         Ordered    cephALEXin (KEFLEX) 500 MG capsule  3 times daily     05/20/18 0047    acetaminophen (TYLENOL 8 HOUR) 650 MG CR tablet  Every 8 hours PRN     05/20/18 0047    ibuprofen (ADVIL,MOTRIN) 400 MG tablet  Every 6 hours PRN     05/20/18 0047    methocarbamol (ROBAXIN) 500 MG tablet  2 times daily     05/20/18 0047           Derwood Kaplan, MD 05/20/18 [phone]

## 2018-05-22 ENCOUNTER — Other Ambulatory Visit (HOSPITAL_COMMUNITY)
Admission: RE | Admit: 2018-05-22 | Discharge: 2018-05-22 | Disposition: A | Discharge status: Home / Self Care | Payer: Medicaid Other | Source: Ambulatory Visit | Attending: Internal Medicine | Admitting: Internal Medicine

## 2018-05-22 ENCOUNTER — Ambulatory Visit (INDEPENDENT_AMBULATORY_CARE_PROVIDER_SITE_OTHER): Payer: Self-pay | Admitting: Internal Medicine

## 2018-05-22 ENCOUNTER — Encounter: Payer: Self-pay | Admitting: Internal Medicine

## 2018-05-22 VITALS — BP 128/78 | HR 90 | Temp 98.4°F | Wt 165.4 lb

## 2018-05-22 DIAGNOSIS — Z8619 Personal history of other infectious and parasitic diseases: Secondary | ICD-10-CM

## 2018-05-22 DIAGNOSIS — R350 Frequency of micturition: Secondary | ICD-10-CM

## 2018-05-22 DIAGNOSIS — R87810 Cervical high risk human papillomavirus (HPV) DNA test positive: Secondary | ICD-10-CM | POA: Diagnosis not present

## 2018-05-22 DIAGNOSIS — R3915 Urgency of urination: Secondary | ICD-10-CM

## 2018-05-22 DIAGNOSIS — A599 Trichomoniasis, unspecified: Secondary | ICD-10-CM

## 2018-05-22 DIAGNOSIS — R102 Pelvic and perineal pain: Secondary | ICD-10-CM | POA: Insufficient documentation

## 2018-05-22 DIAGNOSIS — M549 Dorsalgia, unspecified: Secondary | ICD-10-CM

## 2018-05-22 DIAGNOSIS — R635 Abnormal weight gain: Secondary | ICD-10-CM | POA: Insufficient documentation

## 2018-05-22 DIAGNOSIS — Z6828 Body mass index (BMI) 28.0-28.9, adult: Secondary | ICD-10-CM

## 2018-05-22 DIAGNOSIS — Z9851 Tubal ligation status: Secondary | ICD-10-CM

## 2018-05-22 NOTE — Assessment & Plan Note (Signed)
Pelvic exam and repeat Pap smear were attempted today and unsuccessful due to vaginal bleeding.  Will reattempt at follow-up.

## 2018-05-22 NOTE — Patient Instructions (Addendum)
[name],  It was a pleasure to meet you. I am sorry to hear about your pain. I have ordered some urine studies and an ultrasound of your pelvis. You will be called to schedule this. I will call you with the results of your lab tests. Please stop taking the antibiotic for now. Follow up in 1 week. If you have any questions or concerns, call our clinic at [phone] or after hours call [phone] and ask for the internal medicine resident on call. Thank you!  Dr. [name]

## 2018-05-22 NOTE — Progress Notes (Signed)
   CC: Back pain  HPI:  [name] is a 42 y.o. female with past medical history outlined below here for back pain. For the details of today's visit, please refer to the assessment and plan.  Past Medical History:  Diagnosis Date  . Acne vulgaris   . Depression   . Dyshidrotic eczema   . Gardnerella vaginitis   . Gonococcal cervicitis   . STD (female)     Review of Systems  Musculoskeletal: Positive for back pain.    Physical Exam:  Vitals:   05/22/18 1432  BP: 128/78  Pulse: 90  Temp: 98.4 F (36.9 C)  TempSrc: Oral  SpO2: 99%  Weight: 165 lb 6.4 oz (75 kg)    Constitutional: NAD, appears comfortable Cardiovascular: RRR, no murmurs, rubs, or gallops.  Pulmonary/Chest: CTAB, no wheezes, rales, or rhonchi.  Abdominal: Soft, non tender, non distended.  Extremities: Warm and well perfused. No edema.  Psychiatric: Normal mood and affect  Assessment & Plan:   See Encounters Tab for problem based charting.  Patient discussed with Dr. Criselda Peaches

## 2018-05-22 NOTE — Addendum Note (Signed)
Addended by: Burnell Blanks on: 05/22/2018 03:47 PM   Modules accepted: Orders

## 2018-05-22 NOTE — Assessment & Plan Note (Signed)
Patient reports a 20 pound weight gain over the past 6 months.  Per our records, she has gained 18 pounds since her last recorded weight 1 year ago.  She denies a change in her dietary habits.  Her activity level has actually increased after she started a second part-time job.  She denies any associated symptoms of fatigue, chest pain, or swelling.  She is also presenting today with complaint of pelvic and back pain.  We are obtaining a pelvic ultrasound to evaluate for PCOS.  Checking TSH. Will continue to monitor.

## 2018-05-22 NOTE — Assessment & Plan Note (Addendum)
Patient reports pain deep in her pelvis as well as right flank/back pain.  The pain started 2 to 3 months ago and has progressively worsened.  It has become so severe that she has had difficulty walking over the past few days. She endorses symptoms of urinary frequency and urgency.  She is now sleeping downstairs in her house to be close to the bathroom.  Her symptoms are significantly impacting her life.  She presented to the ED 3 days ago for evaluation.  Urinalysis was significant for pyuria and microscopic hematuria but no evidence of urinary tract infection.  CT renal stone study was negative for nephrolithiasis.  No other acute intra-abdominal abnormalities were noted.  She reports a history of gonorrhea with similar symptoms of pelvic pain.  Denies fevers or any systemic symptoms.  Last vaginal intercourse was 5 months ago. Urine pregnancy test in the ED was negative (she is also status post tubal ligation).  Unfortunately patient started her menstrual cycle today.  Pelvic exam was attempted but visualization of the cervix was limited due to bleeding.  Of note, patient also has history of abnormal Pap smear that tested positive for high risk HPV without evidence of dysplasia.  Discussed with patient multiple possible diagnoses.  We will obtain a urine GC chlamydia probe to hopefully rule out infection.  Will need repeat pelvic exam once patient is off her menstrual cycle.  We will also obtain a transvaginal ultrasound to evaluate her ovaries.  In the setting of her weight gain, PCOS is certainly a possibility.  Endometriosis is also a consideration but less likely given her age.  Lastly, Pap smear needs to be repeated to rule out cervical cancer.  CT abdomen and pelvis was negative for evidence of metastatic disease, however the reproductive tract is difficult to visualize on CT.  --Transvaginal ultrasound --GC chlamydia urine probe --HIV ab --Pelvic exam and Pap smear once off menstrual  cycle --Follow-up 1-2 weeks  ADDENDUM: Urine tested positive for trichomonas. Called patient with results. Sent prescription for metronidazole to her pharmacy. CG/chlamydia and HIV ab negative.

## 2018-05-23 LAB — HIV ANTIBODY (ROUTINE TESTING W REFLEX): HIV Screen 4th Generation wRfx: NONREACTIVE

## 2018-05-23 LAB — URINE CYTOLOGY ANCILLARY ONLY
CHLAMYDIA, DNA PROBE: NEGATIVE
NEISSERIA GONORRHEA: NEGATIVE
Trichomonas: POSITIVE — AB

## 2018-05-23 LAB — TSH: TSH: 0.895 u[IU]/mL (ref 0.450–4.500)

## 2018-05-23 NOTE — Progress Notes (Signed)
Internal Medicine Clinic Attending  Case discussed with Dr. Guilloud at the time of the visit.  We reviewed the resident's history and exam and pertinent patient test results.  I agree with the assessment, diagnosis, and plan of care documented in the resident's note.  

## 2018-05-24 ENCOUNTER — Telehealth: Payer: Self-pay | Admitting: *Deleted

## 2018-05-24 DIAGNOSIS — A599 Trichomoniasis, unspecified: Secondary | ICD-10-CM | POA: Insufficient documentation

## 2018-05-24 MED ORDER — METRONIDAZOLE 500 MG PO TABS: 500.0000 mg | ORAL_TABLET | Freq: Two times a day (BID) | ORAL | 0 refills | Status: DC

## 2018-05-24 NOTE — Addendum Note (Signed)
Addended by: Burnell Blanks on: 05/24/2018 09:33 AM   Modules accepted: Orders

## 2018-05-24 NOTE — Telephone Encounter (Signed)
done

## 2018-05-24 NOTE — Telephone Encounter (Signed)
Pt stated [facility] computer system is completely down and she cannot get her Flagyl rx filled. She would like rx sent to [facility] I called [facility] and this was confirmed. Thanks

## 2018-06-20 ENCOUNTER — Ambulatory Visit: Payer: Medicaid Other

## 2018-07-17 ENCOUNTER — Other Ambulatory Visit: Payer: Self-pay | Admitting: Internal Medicine

## 2018-07-17 DIAGNOSIS — B353 Tinea pedis: Secondary | ICD-10-CM

## 2018-07-17 NOTE — Telephone Encounter (Signed)
refilled 

## 2018-08-16 ENCOUNTER — Ambulatory Visit (HOSPITAL_COMMUNITY): Admission: RE | Admit: 2018-08-16 | Payer: Medicaid Other | Source: Ambulatory Visit

## 2018-10-15 ENCOUNTER — Other Ambulatory Visit: Payer: Self-pay | Admitting: Internal Medicine

## 2018-10-15 DIAGNOSIS — B353 Tinea pedis: Secondary | ICD-10-CM

## 2018-10-29 ENCOUNTER — Other Ambulatory Visit: Payer: Self-pay

## 2018-10-29 DIAGNOSIS — B353 Tinea pedis: Secondary | ICD-10-CM

## 2018-10-29 NOTE — Telephone Encounter (Signed)
clotrimazole (LOTRIMIN) 1 % cream   Refill request @  [facility] [phone] - [place], [place] - [address] [facility] [facility] [address] [phone] (Phone) [phone] (Fax)

## 2018-11-28 ENCOUNTER — Telehealth: Payer: Self-pay | Admitting: *Deleted

## 2018-11-28 ENCOUNTER — Other Ambulatory Visit: Payer: Self-pay | Admitting: Internal Medicine

## 2018-11-28 MED ORDER — LOTRIMIN AF 1 % EX CREA: 1.0000 "application " | TOPICAL_CREAM | Freq: Two times a day (BID) | CUTANEOUS | 0 refills | Status: AC

## 2018-11-28 NOTE — Telephone Encounter (Signed)
Okay....Marland Kitchensent to her pharmacy.

## 2018-11-28 NOTE — Telephone Encounter (Signed)
Pt calls and states she wants a new script for clotrimazole written as brand only, she was given generic and she doesn't like it. She states she does not mind paying out of pocket.but she wants BRAND NAME.

## 2019-01-10 ENCOUNTER — Other Ambulatory Visit: Payer: Self-pay | Admitting: Internal Medicine

## 2019-01-10 DIAGNOSIS — B353 Tinea pedis: Secondary | ICD-10-CM

## 2019-01-11 ENCOUNTER — Other Ambulatory Visit: Payer: Self-pay | Admitting: Internal Medicine

## 2019-01-11 DIAGNOSIS — B353 Tinea pedis: Secondary | ICD-10-CM

## 2019-01-16 NOTE — Telephone Encounter (Signed)
refilled 

## 2019-02-24 ENCOUNTER — Other Ambulatory Visit: Payer: Self-pay | Admitting: Internal Medicine

## 2019-02-24 DIAGNOSIS — B353 Tinea pedis: Secondary | ICD-10-CM

## 2019-06-11 ENCOUNTER — Other Ambulatory Visit: Payer: Self-pay | Admitting: Internal Medicine

## 2019-06-11 DIAGNOSIS — B353 Tinea pedis: Secondary | ICD-10-CM

## 2019-08-05 ENCOUNTER — Ambulatory Visit (INDEPENDENT_AMBULATORY_CARE_PROVIDER_SITE_OTHER): Payer: Self-pay | Admitting: Internal Medicine

## 2019-08-05 ENCOUNTER — Encounter: Payer: Self-pay | Admitting: Internal Medicine

## 2019-08-05 ENCOUNTER — Other Ambulatory Visit: Payer: Self-pay

## 2019-08-05 VITALS — BP 113/74 | HR 85 | Temp 98.0°F | Ht 65.0 in | Wt 164.4 lb

## 2019-08-05 DIAGNOSIS — B353 Tinea pedis: Secondary | ICD-10-CM

## 2019-08-05 DIAGNOSIS — F419 Anxiety disorder, unspecified: Secondary | ICD-10-CM

## 2019-08-05 DIAGNOSIS — R232 Flushing: Secondary | ICD-10-CM

## 2019-08-05 DIAGNOSIS — F329 Major depressive disorder, single episode, unspecified: Secondary | ICD-10-CM

## 2019-08-05 DIAGNOSIS — F32A Depression, unspecified: Secondary | ICD-10-CM

## 2019-08-05 MED ORDER — VENLAFAXINE HCL ER 37.5 MG PO CP24: 37.5000 mg | ORAL_CAPSULE | Freq: Every day | ORAL | 1 refills | Status: DC

## 2019-08-05 MED ORDER — CLOTRIMAZOLE 1 % EX CREA: TOPICAL_CREAM | CUTANEOUS | 0 refills | Status: DC

## 2019-08-05 NOTE — Patient Instructions (Addendum)
FOLLOW-UP INSTRUCTIONS  Please return to the clinic in early January we will need to obtain several labs prior to considering additional treatment for your hot flashes and anxiety before we can prescribe additional medication.  I have prescribed you a course of venlafaxine to be taken at 37.5 mg daily for 3 to 4 days.  If you do not notice improvement of his symptoms he can increase this to 2 tablets daily.  If there is any chance you may be pregnant you should refrain from this medication.   Thank you for your visit to the [facility] today. If you have any questions or concerns please call us at [phone].

## 2019-08-05 NOTE — Progress Notes (Signed)
   CC: hot flashes  HPI:[name] is a 43 y.o. female who presents for evaluation of hot flashes. Please see individual problem based A/P for details.  Hot flashes: Episodes of feeling very warm, sweating, for one month. Has to take off her clothes when this occurs. This is disrupting her life significantly. She feels as if she is about to die during these episodes. Starts in her chest and progresses into her head/shoulders and then throughout the body. She feels faint and lightheaded during some of a more intense episode. The episodes last around 2 min or less. She stated that they are inhibiting her from doing her job appropriately.  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  She will have 2-3 episodes per hour on most days.  She is not on a medication at this time.  She does not think it is associated with a fever but has not checked a fever with a thermometer. She continues to have regular monthly menses that are on time without irregularity.  She denies increased pain or blood loss.  She denied pruritis or drenching sweats.  She is a never smoker. No DVT/PE history .  No family history of breast cancer. Given the patient's history and physical exam she is likely experiencing perimenopausal hot flashes but I cannot see this with certainty.  I recommended a beta hCG, TSH and prolactin level today as well as a CMP and CBC.  The patient would like to defer this though after January when her insurance activates.  This seems reasonable.  Plan: Given severity of her symptoms, history of anxiety we have initiated treatment with venlafaxine at 37.5 mg ER daily to be increased after day for 75 mg by the patient She is to return in January for laboratory evaluation Please order beta-hCG, TSH, prolactin level and consider CMP/CBC if indicated The patient was considering discussing her  anxiety and depression with Dessie Coma, please continue to discuss this and refer if she is willing and able  Past Medical History:  Diagnosis Date  . Acne vulgaris   . Depression   . Dyshidrotic eczema   . Gardnerella vaginitis   . Gonococcal cervicitis   . STD (female)    Review of Systems:  ROS negative except as per HPI.  Physical Exam: Vitals:   08/05/19 1605  BP: 113/74  Pulse: 85  Temp: 98 F (36.7 C)  TempSrc: Oral  SpO2: 100%  Weight: 164 lb 6.4 oz (74.6 kg)  Height: 5\' 5"  (1.651 m)   General: A/O x4, in no acute distress, afebrile, nondiaphoretic HEENT: PEERL, EMO intact Cardio: RRR, no mrg's  Pulmonary: CTA bilaterally, no wheezing or crackles  Abdomen: Bowel sounds normal, soft, nontender  MSK: BLE nontender, nonedematous Neuro: Alert, CNII-XII grossly intact, conversational, strength 5/5 in the upper and lower extremities bilaterally, normal gait Psych: Appropriate affect, not depressed in appearance, engages well  Assessment & Plan:   See Encounters Tab for problem based charting.  Patient discussed with Dr. Angelia Mould

## 2019-08-06 ENCOUNTER — Encounter: Payer: Self-pay | Admitting: Internal Medicine

## 2019-08-06 NOTE — Assessment & Plan Note (Signed)
The patient notes marked anxiety that is persistent and associate with multiple triggers.  She was previously on sertraline and alprazolam for this but was not able to follow-up and her prescriptions expired.  Today we discussed meeting with our internal behavioral health specialist as she does not wish to go to an external stand-alone behavioral health clinic.  Additionally, we discussed initiation of venlafaxine to treat both the hot flashes and anxiety.  I feel as if she would benefit greatly from meeting with Dessie Coma and appropriate titration of her SNRI.

## 2019-08-07 NOTE — Progress Notes (Signed)
Internal Medicine Clinic Attending  Case discussed with Dr. Harbrecht at the time of the visit.  We reviewed the resident's history and exam and pertinent patient test results.  I agree with the assessment, diagnosis, and plan of care documented in the resident's note.   

## 2019-10-16 IMAGING — CT CT RENAL STONE PROTOCOL
2 of 4 series · 17 of 46 positions shown, 19 images · non-contrast
Comparison: None.

CLINICAL DATA: Abdominal and flank pain.  Assess for kidney stone.

EXAM:
CT ABDOMEN AND PELVIS WITHOUT CONTRAST
TECHNIQUE: Multidetector CT imaging of the abdomen and pelvis was performed
following the standard protocol without IV contrast.

[Series 2: axial st · axial · 0.91mm/px · z∈[-628,-268]mm · 14 of 80 slices shown, 16 images]
[im 4/80  soft-tissue]
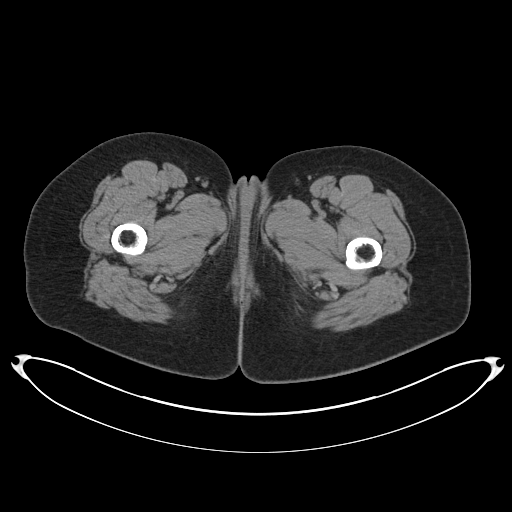
[im 4/80  bone]
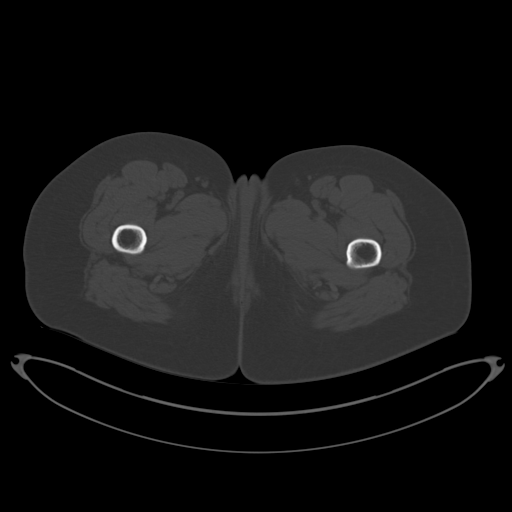
[im 12/80  soft-tissue]
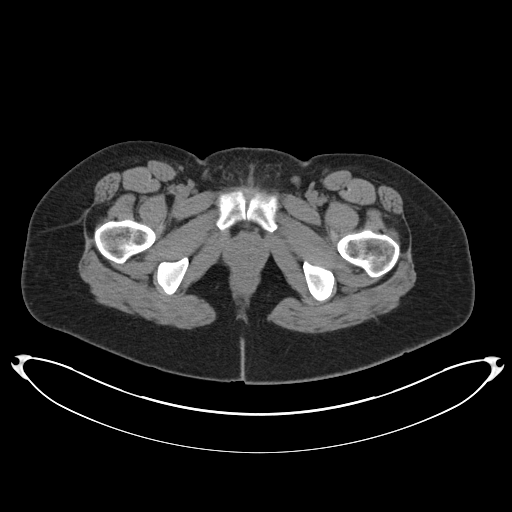
[im 16/80  soft-tissue]
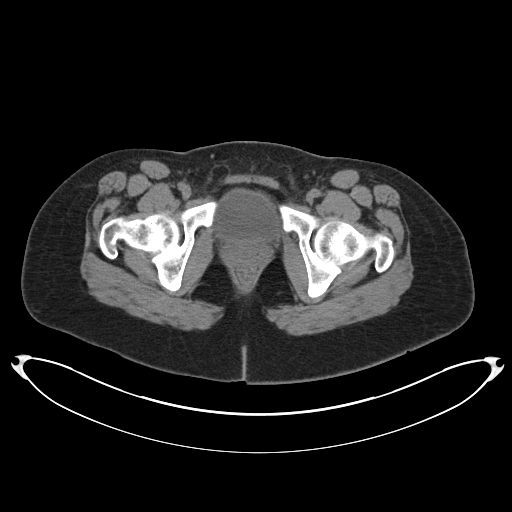
[im 23/80  soft-tissue]
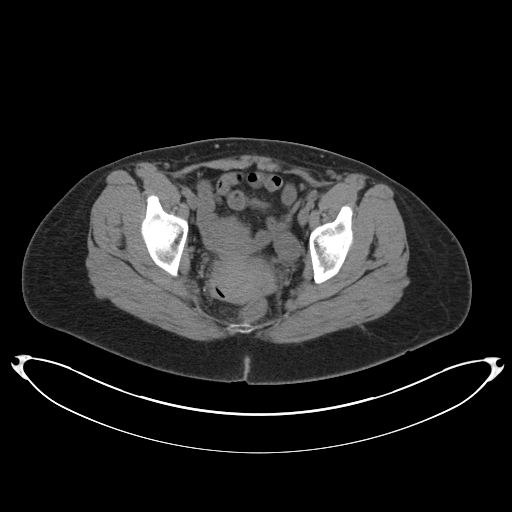
[im 27/80  soft-tissue]
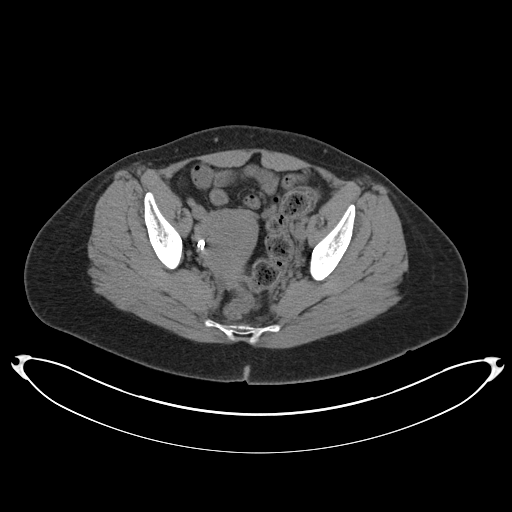
[im 31/80  soft-tissue]
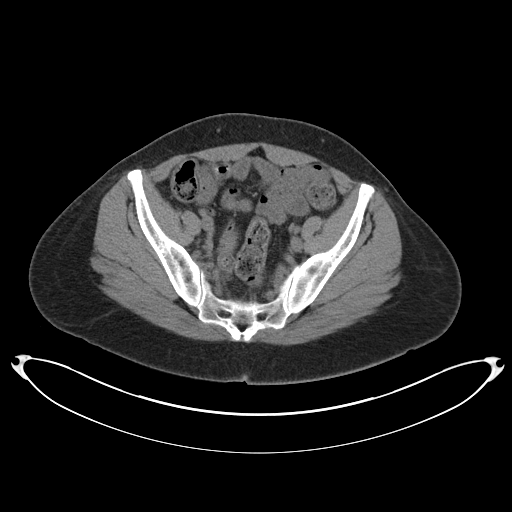
[im 38/80  soft-tissue]
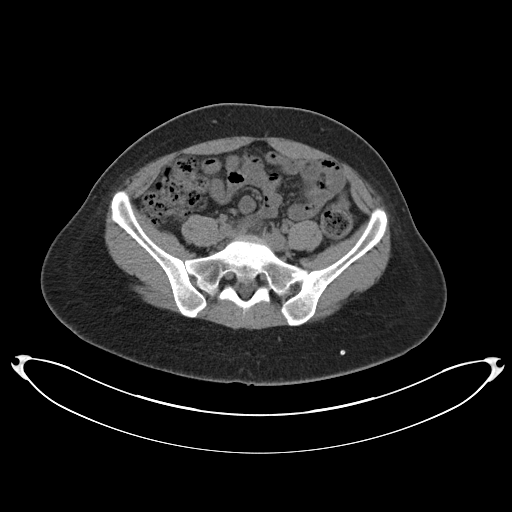
[im 42/80  soft-tissue]
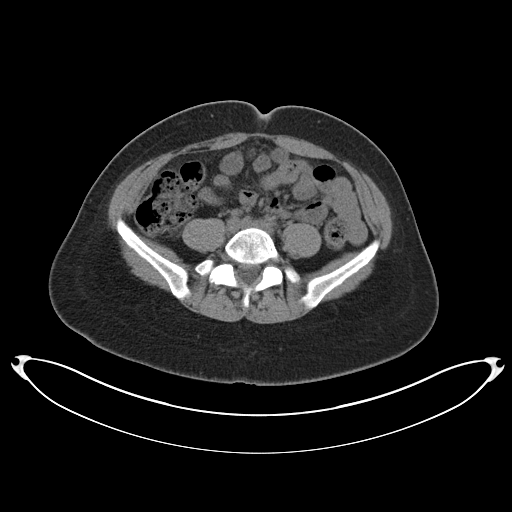
[im 49/80  soft-tissue]
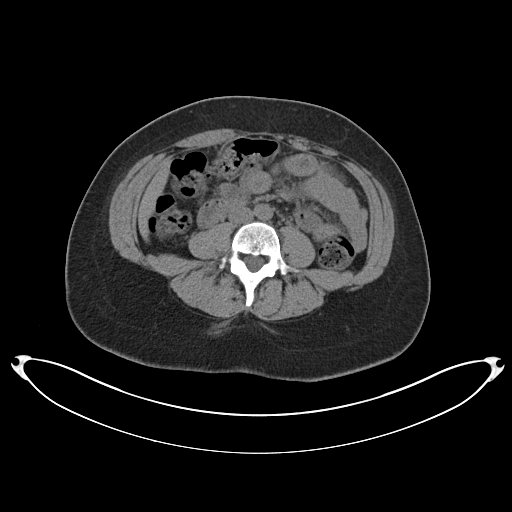
[im 49/80  bone]
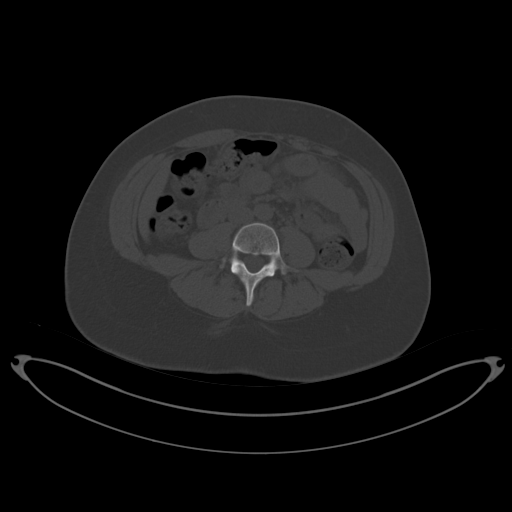
[im 53/80  soft-tissue]
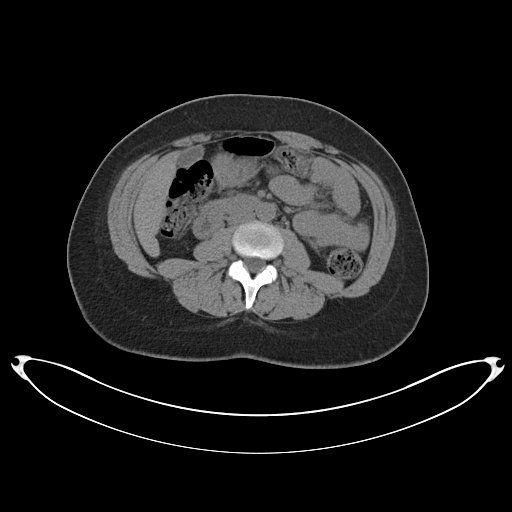
[im 61/80  soft-tissue]
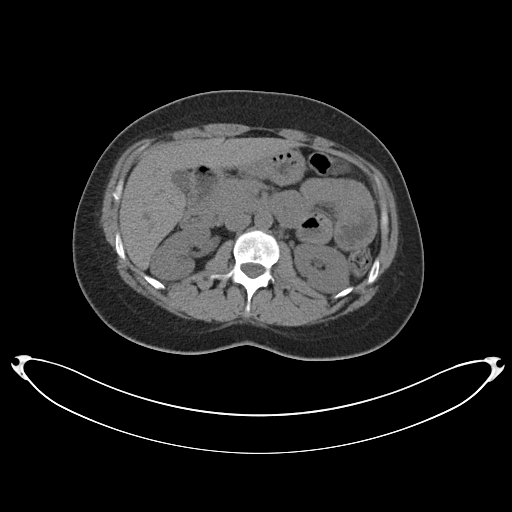
[im 64/80  soft-tissue]
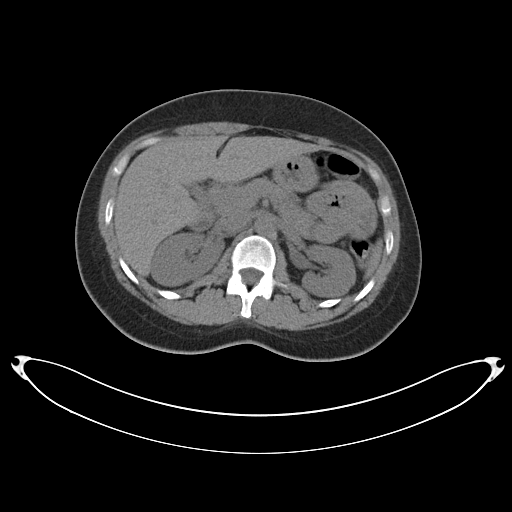
[im 68/80  soft-tissue]
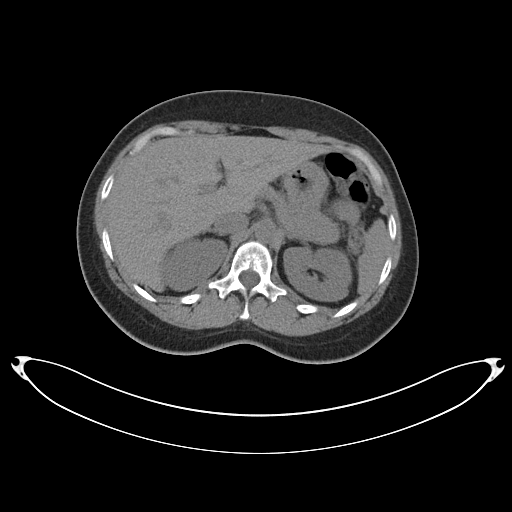
[im 76/80  soft-tissue]
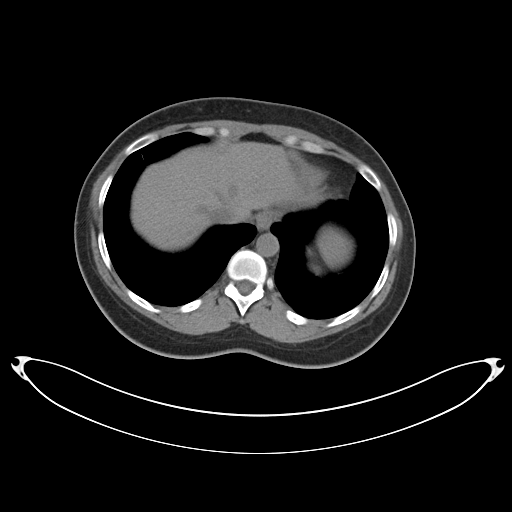

[Series 5: coronal · coronal · 0.81mm/px · 3 of 149 slices shown]
[im 50/149  soft-tissue]
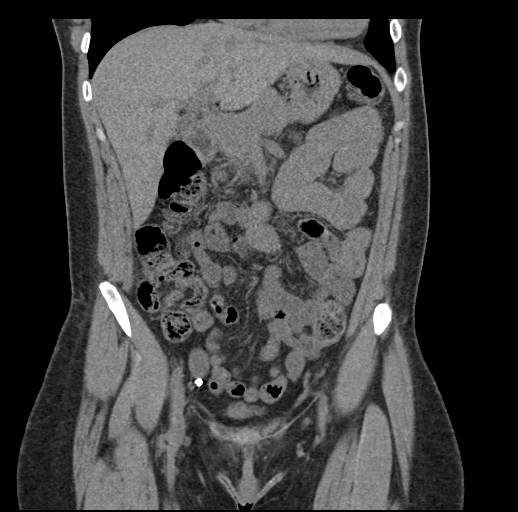
[im 66/149  soft-tissue]
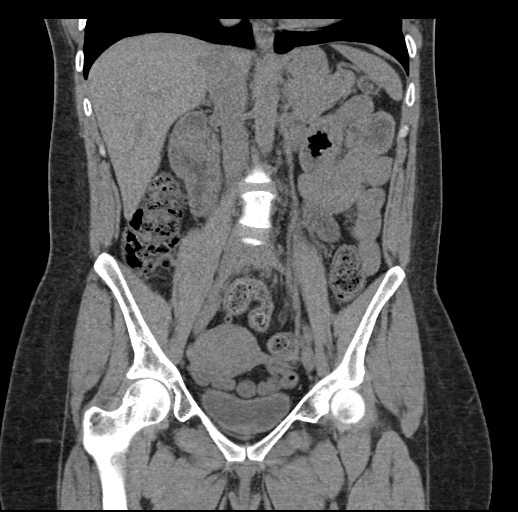
[im 83/149  soft-tissue]
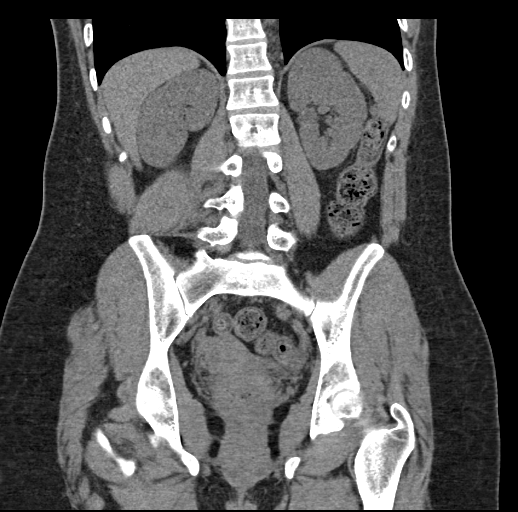

[17 of 46 positions shown; findings below may reference images not displayed]

FINDINGS: LOWER CHEST: Bibasilar atelectasis/scarring. The visualized heart
size is normal. No pericardial effusion.

HEPATOBILIARY: Normal.

PANCREAS: Normal.

SPLEEN: Normal.

ADRENALS/URINARY TRACT: Kidneys are orthotopic, demonstrating normal
size and morphology. No nephrolithiasis, hydronephrosis; limited
assessment for renal masses by nonenhanced CT. The unopacified
ureters are normal in course and caliber. Urinary bladder is
partially distended and unremarkable. Normal adrenal glands.

STOMACH/BOWEL: The stomach, small and large bowel are normal in
course and caliber without inflammatory changes, sensitivity
decreased by lack of enteric contrast. Normal appendix.

VASCULAR/LYMPHATIC: Aortoiliac vessels are normal in course and
caliber. No lymphadenopathy by CT size criteria.

REPRODUCTIVE: Normal. Migrated LEFT tubal ligation clip in RIGHT
pelvis.

OTHER: No intraperitoneal free fluid or free air.

MUSCULOSKELETAL: Non-acute. Small fat containing supraumbilical
ventral hernia. Small RIGHT central L5-S1 disc protrusion.
IMPRESSION: 1. No nephrolithiasis, hydronephrosis or acute
intra-abdominal/pelvic process.
2. Migrated LEFT tubal ligation clip.

## 2019-12-12 ENCOUNTER — Other Ambulatory Visit: Payer: Self-pay | Admitting: Internal Medicine

## 2019-12-12 DIAGNOSIS — B353 Tinea pedis: Secondary | ICD-10-CM

## 2019-12-13 ENCOUNTER — Encounter: Payer: Self-pay | Admitting: *Deleted

## 2020-01-22 ENCOUNTER — Other Ambulatory Visit: Payer: Self-pay | Admitting: Internal Medicine

## 2020-01-22 DIAGNOSIS — B353 Tinea pedis: Secondary | ICD-10-CM

## 2020-02-03 ENCOUNTER — Encounter: Payer: Self-pay | Admitting: Internal Medicine

## 2020-02-03 ENCOUNTER — Ambulatory Visit (INDEPENDENT_AMBULATORY_CARE_PROVIDER_SITE_OTHER): Payer: 59 | Admitting: Internal Medicine

## 2020-02-03 VITALS — BP 119/71 | HR 81 | Temp 98.4°F | Wt 163.2 lb

## 2020-02-03 DIAGNOSIS — L309 Dermatitis, unspecified: Secondary | ICD-10-CM | POA: Insufficient documentation

## 2020-02-03 DIAGNOSIS — N951 Menopausal and female climacteric states: Secondary | ICD-10-CM | POA: Insufficient documentation

## 2020-02-03 MED ORDER — TRIAMCINOLONE ACETONIDE 0.5 % EX OINT: 1.0000 "application " | TOPICAL_OINTMENT | Freq: Two times a day (BID) | CUTANEOUS | 0 refills | Status: DC

## 2020-02-03 NOTE — Assessment & Plan Note (Addendum)
Started having hot flashes in November with irregular periods.  No abdominal surgeries, still has both ovaries.  She had a tubal ligation procedure in 2010.  Before that she was on depo provera.  She was seen for this issue in December.  Her symptoms are debilitating, affecting her sleep and work life.  She will have to stop work and cool off.  She purchased a new air conditioner for her room purchased due to difficulty sleeping she runs it as low as 69 and it still feels hot sometimes.  We discussed working this up further now that she has health insurance.  If her workup is consistent with perimenopause, we discussed the risks and benefits of estrogen replacement therapy.  Overall she has low cardiovascular risk.    -work up further with TSH, FSH, Prolactin, HCG  -prescribe estrogen replacement therapy if appropriate

## 2020-02-03 NOTE — Progress Notes (Addendum)
   CC: perimenopausal symptoms, Eczema  HPI:  [name] is a 44 y.o. female with PMH below.  Today we will address perimenopausal symptoms, Eczema   Please see A&P for status of the patient's chronic medical conditions  Past Medical History:  Diagnosis Date  . Acne vulgaris   . Depression   . Dyshidrotic eczema   . Gardnerella vaginitis   . Gonococcal cervicitis   . STD (female)    Review of Systems:  ROS: Pulmonary: pt denies increased work of breathing, shortness of breath,  Cardiac: pt denies palpitations, chest pain,  Abdominal: pt denies abdominal pain, nausea, vomiting, or diarrhea   Physical Exam:  Vitals:   02/03/20 1333  BP: 119/71  Pulse: 81  Temp: 98.4 F (36.9 C)  TempSrc: Oral  SpO2: 100%  Weight: 163 lb 3.2 oz (74 kg)   Cardiac: JVD flat, normal rate and rhythm, clear s1 and s2, no murmurs, rubs or gallops, no LE edema Pulmonary: CTAB, not in distress Abdominal: non distended abdomen, soft and nontender Psych: Alert, conversant, in good spirits Skin: Media Information   Document Information  Photos    02/03/2020 13:49  Attached To:  Office Visit on 02/03/20 with Angelita Ingles, MD  Source Information  Vaniyah Lansky, Kimberlee Nearing, MD  [facility]      Social History   Socioeconomic History  . Marital status: Single    Spouse name: Not on file  . Number of children: Not on file  . Years of education: Not on file  . Highest education level: Not on file  Occupational History  . Not on file  Tobacco Use  . Smoking status: Never Smoker  . Smokeless tobacco: Never Used  Substance and Sexual Activity  . Alcohol use: No    Alcohol/week: 0.0 standard drinks  . Drug use: Never  . Sexual activity: Not on file  Other Topics Concern  . Not on file  Social History Narrative   Has 2 children.   Father's children passed away from ruptured aneurysm in 2005.   Works at [employer].   Social Determinants of Health   Financial  Resource Strain:   . Difficulty of Paying Living Expenses:   Food Insecurity:   . Worried About Programme researcher, broadcasting/film/video in the Last Year:   . Barista in the Last Year:   Transportation Needs:   . Freight forwarder (Medical):   Marland Kitchen Lack of Transportation (Non-Medical):   Physical Activity:   . Days of Exercise per Week:   . Minutes of Exercise per Session:   Stress:   . Feeling of Stress :   Social Connections:   . Frequency of Communication with Friends and Family:   . Frequency of Social Gatherings with Friends and Family:   . Attends Religious Services:   . Active Member of Clubs or Organizations:   . Attends Banker Meetings:   Marland Kitchen Marital Status:   Intimate Partner Violence:   . Fear of Current or Ex-Partner:   . Emotionally Abused:   Marland Kitchen Physically Abused:   . Sexually Abused:     Family History  Problem Relation Age of Onset  . Stroke Mother        age 72 when she had the stroke  . Hyperlipidemia Father     Assessment & Plan:   See Encounters Tab for problem based charting.  Patient discussed with Dr. Cleda Daub

## 2020-02-03 NOTE — Patient Instructions (Signed)
Ms. Slee please apply the steroid cream to your foot as prescribed.  We will run some lab testing to evaluate you for causes of your hot flashes.  Additionally please schedule a follow up in one month for pap smear and to follow up on the efficacy of the steroid cream.

## 2020-02-03 NOTE — Assessment & Plan Note (Signed)
Patient with hyperpigmented lesions on lateral malleolus area of feet.  She has no other skin lesions.  She reports prior similar lesions on palm/wrist area but none currently.  Lesions not pruritic have a sandpaper like texture but are macular.  She has had extensive treatment with topical antifungals without success.  She feels the appearance improves slightly from the moisture.  No systemic atopic conditions.  Areas do not appear typical for what I would expect fungal infections to look like and no pruritis or improvement with antifungals.    -trial triamcinolone 0.5% for likely eczema

## 2020-02-04 LAB — FOLLICLE STIMULATING HORMONE: FSH: 70.8 m[IU]/mL

## 2020-02-04 LAB — PROLACTIN: Prolactin: 7.9 ng/mL (ref 4.8–23.3)

## 2020-02-04 LAB — TSH: TSH: 0.842 u[IU]/mL (ref 0.450–4.500)

## 2020-02-04 LAB — BETA HCG QUANT (REF LAB): hCG Quant: <1 m[IU]/mL

## 2020-02-04 NOTE — Progress Notes (Signed)
Internal Medicine Clinic Attending  Case discussed with Dr. Winfrey  at the time of the visit.  We reviewed the resident's history and exam and pertinent patient test results.  I agree with the assessment, diagnosis, and plan of care documented in the resident's note.  

## 2020-02-06 ENCOUNTER — Telehealth: Payer: Self-pay | Admitting: Internal Medicine

## 2020-02-06 NOTE — Telephone Encounter (Signed)
Multiple attempts made today to call [name].  I only received a busy signal on the other end I am not sure if you happen to be on the phone or if this potentially is not a correct number.  I also attempted to contact her emergency contact her mother however the phone rang with no voicemail and no one answering.  I will attempt to try to call back tomorrow and/or early next week to go over her results and plan going forward.

## 2020-02-12 ENCOUNTER — Telehealth: Payer: Self-pay | Admitting: Internal Medicine

## 2020-02-12 NOTE — Telephone Encounter (Signed)
Attempted to call patient this afternoon.  Again busy signal noted with no voicemail.  May be best just to discuss her lab results and risks vs benefits of hormone replacement therapy at next visit or address earlier if patient calls in.  Please verify phone number in chart is correct.

## 2020-03-02 ENCOUNTER — Ambulatory Visit: Payer: 59 | Admitting: Internal Medicine

## 2020-04-21 ENCOUNTER — Telehealth: Payer: Self-pay | Admitting: *Deleted

## 2020-04-21 NOTE — Telephone Encounter (Signed)
Talked to pt - recommended getting test for covid. Stated she had found rapid testing site. Instructed to remain isolated until she get her results; which she understands. And to call if dev any other symptoms.

## 2020-04-21 NOTE — Telephone Encounter (Signed)
Call from pt for an appt- c/o stuffy nose and sob. Denies any other symptoms of covid. She has not had the vaccines. Stated she went to a cookout on Saturday. Started feeling bad on Sunday; thinking she may have a hangover. On Monday, stated she could not move b/c she was throwing up. She has not taken any OTC meds. Thinking she might have a cold. States feels slightly better today. She mentioned doing a rapid test for covid; informed we do not perform testing here at the office. Please advise  Thanks

## 2020-05-04 ENCOUNTER — Encounter: Payer: Self-pay | Admitting: Internal Medicine

## 2020-05-04 ENCOUNTER — Other Ambulatory Visit: Payer: Self-pay

## 2020-05-04 ENCOUNTER — Telehealth: Payer: Self-pay

## 2020-05-04 ENCOUNTER — Ambulatory Visit (INDEPENDENT_AMBULATORY_CARE_PROVIDER_SITE_OTHER): Payer: 59 | Admitting: Internal Medicine

## 2020-05-04 DIAGNOSIS — U071 COVID-19: Secondary | ICD-10-CM | POA: Diagnosis not present

## 2020-05-04 MED ORDER — GUAIFENESIN ER 600 MG PO TB12: 600.0000 mg | ORAL_TABLET | Freq: Two times a day (BID) | ORAL | 0 refills | Status: DC

## 2020-05-04 NOTE — Assessment & Plan Note (Signed)
Patient recently diagnosed with COVID 19 reaches out over a telehealth visit with symptomatic complaints of COVID 19 infection. Patient states that when she was originally diagnosed her predominant COVID symptoms was shortness of breath and decreased taste. She states that she is doing much better, but is having "chest congestion" and headaches which are sharp and bilateral in nature. She states that her chest congestion makes it difficult for her to finish conversations. She states that she is able to talk and answer in full sentences, but will need a minute to catch her breath. She does not have a productive cough, but has noted times where her nose produces clear mucus. She also endorses a headache that began when she was first diagnosed with COVID. It is sharp and bilateral. She does not endorse auras, nausea, vomiting, or aversion to light. She is worried about her headache because her husband passed from a brain aneurysm, and when she does get headaches, she is worried that she is having one.   During out discussion, patient was able to speak in full sentences, her breathing was non-labored. She did sound anxious over the phone, and due to the limitations of a televisit a pulmonary exam was unable to be performed. Reassurance was given. Patient instructed to call back if her [facility] worsens. For her headache we discussed proper hydration and conservative management with tylenol/NSAID. For her chest congestion, mucinex.  - Mucinex 600 mg twice daily  - Patient given strict precautions for presenting to the ED.  - Will follow up at next visit.

## 2020-05-04 NOTE — Progress Notes (Signed)
  [facility] Internal Medicine Residency Telephone Encounter Continuity Care Appointment  HPI:   This telephone encounter was created for [name] on 05/04/2020 for the following purpose/cc Covid symptoms.   Past Medical History:  Past Medical History:  Diagnosis Date  . Acne vulgaris   . Depression   . Dyshidrotic eczema   . Gardnerella vaginitis   . Gonococcal cervicitis   . STD (female)       ROS:  Review of Systems  Constitutional: Positive for chills and malaise/fatigue. Negative for diaphoresis and fever.  Respiratory: Positive for cough and shortness of breath. Negative for hemoptysis, sputum production and wheezing.   Gastrointestinal: Negative for abdominal pain, blood in stool, constipation, diarrhea, melena, nausea and vomiting.  Musculoskeletal: Negative for myalgias.  Skin: Negative for itching and rash.  Neurological: Positive for headaches.     Assessment / Plan / Recommendations:   Please see A&P under problem oriented charting for assessment of the patient's acute and chronic medical conditions.   As always, pt is advised that if symptoms worsen or new symptoms arise, they should go to an urgent care facility or to to ER for further evaluation.   Consent and Medical Decision Making:   Patient discussed with Dr. Sandre Kitty  This is a telephone encounter between [name] and Dolan Amen on 05/04/2020 for Covid/chest congestion. The visit was conducted with the patient located at home and Dolan Amen at [facility]. The patient's identity was confirmed using their DOB and current address. The patient has consented to being evaluated through a telephone encounter and understands the associated risks (an examination cannot be done and the patient may need to come in for an appointment) / benefits (allows the patient to remain at home, decreasing exposure to coronavirus). I personally spent 18 minutes on medical discussion.

## 2020-05-04 NOTE — Telephone Encounter (Signed)
Received TC from patient, states she tested positive for Covid on 04/23/20, states she is feeling better, but still c/o "lingering chest congestion and intermittent daily headaches".  Pt given Telehealth appt for today at 2:15, Red team. SChaplin, RN,BSN

## 2020-05-05 NOTE — Progress Notes (Signed)
Internal Medicine Clinic Attending  Case discussed with Dr. Winters at the time of the visit.  We reviewed the resident's history and exam and pertinent patient test results.  I agree with the assessment, diagnosis, and plan of care documented in the resident's note.  Margurette Brener, M.D., Ph.D.  

## 2020-05-28 ENCOUNTER — Encounter: Payer: Self-pay | Admitting: Student

## 2020-05-28 ENCOUNTER — Other Ambulatory Visit: Payer: Self-pay

## 2020-05-28 ENCOUNTER — Ambulatory Visit (INDEPENDENT_AMBULATORY_CARE_PROVIDER_SITE_OTHER): Payer: 59 | Admitting: Student

## 2020-05-28 DIAGNOSIS — F419 Anxiety disorder, unspecified: Secondary | ICD-10-CM

## 2020-05-28 DIAGNOSIS — N951 Menopausal and female climacteric states: Secondary | ICD-10-CM

## 2020-05-28 DIAGNOSIS — G43019 Migraine without aura, intractable, without status migrainosus: Secondary | ICD-10-CM | POA: Diagnosis not present

## 2020-05-28 DIAGNOSIS — L309 Dermatitis, unspecified: Secondary | ICD-10-CM | POA: Diagnosis not present

## 2020-05-28 DIAGNOSIS — F32A Depression, unspecified: Secondary | ICD-10-CM

## 2020-05-28 MED ORDER — PAROXETINE HCL 10 MG PO TABS: 10.0000 mg | ORAL_TABLET | Freq: Every day | ORAL | 0 refills | Status: DC

## 2020-05-28 MED ORDER — TRIAMCINOLONE ACETONIDE 0.5 % EX OINT: 1.0000 "application " | TOPICAL_OINTMENT | Freq: Two times a day (BID) | CUTANEOUS | 0 refills | Status: DC

## 2020-05-28 NOTE — Assessment & Plan Note (Signed)
Patient continues to have intermittent throbbing headaches. States her husband passed away from brain aneurysm so her headaches makes her scared. The headaches are not as frequent as as they were before. Patient states she is under a lot of stress with her work and has not been getting enough sleep. Lack of sleep and increased stress likely the source of patient's headache. Patient is informed to take melatonin for sleep.  We will continue to monitor.

## 2020-05-28 NOTE — Assessment & Plan Note (Signed)
Patient with PHQ-9 score of 3. However, during encounter, patient expressed having significant amount of stress with her work, recent deaths and ongoing perimenopausal symptoms. Patient states her anxiety has improved but she continues to have difficulty sleeping, changes in the mood, hot flashes and recent abdominal cramps. Patient has a history of depression and anxiety but is not on any therapy at the moment. Patient has tried sertraline, venlafaxine and alprazolam with no improvement in her symptoms.  Plan to start an SSRI and monitor for symptomatic improvement.  Plan: --Start Paxil 10 mg for 2 weeks, then 20 mg --OTC melatonin to help with sleep --Consider referral for therapy/counseling if no improvements in mood disorder at next office visit.

## 2020-05-28 NOTE — Assessment & Plan Note (Signed)
Patient continues to have hot flashes, mood changes and difficulty sleeping. Recent work-up showed normal TSH, prolactin, hCG but elevated FSH of 70.8 consistent with perimenopause. Patient states she is unaware of early onset of menopause in her family.  Patient reports severe abdominal cramps that started 4 days ago.  Reports having irregular cycles with hot flashes since she turned 42.  Will trial patient on nonhormonal treatments before starting estrogen replacement therapy.  Plan: --Start Paxil 10 mg daily for 2 weeks then 20 mg daily --Over-the-counter melatonin for sleep --Follow-up in 3 months

## 2020-05-28 NOTE — Progress Notes (Signed)
   CC: Depressed mood/insomnia  HPI:  Ms.Kye L Casella is a 44 y.o. female with PMH of depression, eczema and STI who presents for med refill and an evaluation of depressed mood and trouble sleeping.   Please see problem based charting for evaluation, assessment and plan.  Past Medical History:  Diagnosis Date  . Acne vulgaris   . Depression   . Dyshidrotic eczema   . Gardnerella vaginitis   . Gonococcal cervicitis   . STD (female)    Review of Systems:  All ROS negative otherwise as stated in the HPI  Physical Exam:  General: Anxious and talkative middle-aged woman. No acute distress. Well nourished, well developed. Head: Normocephalic, atraumatic. Cardiac: RRR. No murmurs, rubs or gallops. S1, S2. No lower extremities edema Respiratory: Lungs CTAB. No wheezing or crackles. No increased WOB Abdominal: Soft, symmetric and non tender. No organomegaly. Normal bowel sounds Skin: Warm, dry. Nonprutitic hyperpigmented lesions on lateral and medial malleolus area of feet with sandpaper-like texture. Extremities: Atraumatic. Full ROM. Pulse palpable. Neuro: A&O x 3. Moves all extremities. Normal sensation. Psych: Anxious mood. Normal judgement  Vitals:   05/28/20 1115  BP: 135/88  Pulse: 85  SpO2: 100%  Weight: 164 lb 1.6 oz (74.4 kg)    Assessment & Plan:   See Encounters Tab for problem based charting.  Patient seen with Dr. Yolande Jolly, MD, MPH

## 2020-05-28 NOTE — Patient Instructions (Signed)
Thank you, [name] for allowing [place] to provide your care today. Today we discussed your depression, foot rash and perimenopausal symptoms. We started a new medicine for your mood and refilled your cream.     I have ordered the following medication/changed the following medications:  1. Paxil 10 mg daily at bedtime for 2 weeks and 20 mg at bedtime after that. 2. Take over the counter melatonin 10 mg at bed time for sleep. 3. We refilled your triamcinolone cream  Please follow-up as needed  Should you have any questions or concerns please call the internal medicine clinic at [phone].    Sharrell Ku, MD, MPH [facility]  My Chart Access: [email or web address]   If you have not already done so, please get your COVID 19 vaccine  To schedule an appointment for a COVID vaccine choice any of the following: Go to [email or web address]   Go to [email or web address]                  Call [phone]                                     Call [phone] and select Option 2

## 2020-05-28 NOTE — Assessment & Plan Note (Addendum)
Patient states the steroid cream that was prescribed for her helped with lesions on her feet but a request for refill was rejected recently so she has not had any cream to use for the last few days. Patient states the lesions on her legs appeared about 10 years ago and she has tried antifungals without improvement.  She has similar lesions on her hands but these have resolved. States her current lesions improve temporarily with the steroid cream but then returns.  Plan to continue current treatment with triamcinolone and refer to dermatologist if no improvement at next office visit.  Plan: --Continue triamcinolone 0.5% --Consider dermatology referral

## 2020-06-01 ENCOUNTER — Other Ambulatory Visit: Payer: Self-pay

## 2020-06-01 ENCOUNTER — Encounter (HOSPITAL_COMMUNITY): Payer: Self-pay | Admitting: Emergency Medicine

## 2020-06-01 ENCOUNTER — Encounter: Payer: 59 | Admitting: Student

## 2020-06-01 ENCOUNTER — Telehealth: Payer: Self-pay | Admitting: Student

## 2020-06-01 ENCOUNTER — Telehealth: Payer: Self-pay

## 2020-06-01 ENCOUNTER — Ambulatory Visit (HOSPITAL_COMMUNITY)
Admission: EM | Admit: 2020-06-01 | Discharge: 2020-06-01 | Disposition: A | Discharge status: Home / Self Care | Payer: 59 | Attending: Internal Medicine | Admitting: Internal Medicine

## 2020-06-01 DIAGNOSIS — Z3202 Encounter for pregnancy test, result negative: Secondary | ICD-10-CM | POA: Diagnosis not present

## 2020-06-01 DIAGNOSIS — N730 Acute parametritis and pelvic cellulitis: Secondary | ICD-10-CM | POA: Diagnosis present

## 2020-06-01 LAB — CBC WITH DIFFERENTIAL/PLATELET
Abs Immature Granulocytes: 0.02 10*3/uL (ref 0.00–0.07)
Basophils Absolute: 0 10*3/uL (ref 0.0–0.1)
Basophils Relative: 1 %
Eosinophils Absolute: 0 10*3/uL (ref 0.0–0.5)
Eosinophils Relative: 0 %
HCT: 39.2 % (ref 36.0–46.0)
Hemoglobin: 12.9 g/dL (ref 12.0–15.0)
Immature Granulocytes: 0 %
Lymphocytes Relative: 25 %
Lymphs Abs: 1.9 10*3/uL (ref 0.7–4.0)
MCH: 29.7 pg (ref 26.0–34.0)
MCHC: 32.9 g/dL (ref 30.0–36.0)
MCV: 90.1 fL (ref 80.0–100.0)
Monocytes Absolute: 0.4 10*3/uL (ref 0.1–1.0)
Monocytes Relative: 5 %
Neutro Abs: 5.3 10*3/uL (ref 1.7–7.7)
Neutrophils Relative %: 69 %
Platelets: 201 10*3/uL (ref 150–400)
RBC: 4.35 MIL/uL (ref 3.87–5.11)
RDW: 13.2 % (ref 11.5–15.5)
WBC: 7.6 10*3/uL (ref 4.0–10.5)
nRBC: 0 % (ref 0.0–0.2)

## 2020-06-01 LAB — POCT URINALYSIS DIPSTICK, ED / UC
Glucose, UA: NEGATIVE mg/dL
Ketones, ur: 80 mg/dL — AB
Nitrite: NEGATIVE
Protein, ur: 30 mg/dL — AB
Specific Gravity, Urine: 1.025 (ref 1.005–1.030)
Urobilinogen, UA: 0.2 mg/dL (ref 0.0–1.0)
pH: 6 (ref 5.0–8.0)

## 2020-06-01 LAB — POC URINE PREG, ED: Preg Test, Ur: NEGATIVE

## 2020-06-01 MED ORDER — CEFTRIAXONE SODIUM 500 MG IJ SOLR
INTRAMUSCULAR | Status: AC
  Filled 2020-06-01: qty 500

## 2020-06-01 MED ORDER — ONDANSETRON 4 MG PO TBDP
4.0000 mg | ORAL_TABLET | Freq: Once | ORAL | Status: AC
  Administered 2020-06-01: 4 mg via ORAL

## 2020-06-01 MED ORDER — KETOROLAC TROMETHAMINE 60 MG/2ML IM SOLN
30.0000 mg | Freq: Once | INTRAMUSCULAR | Status: AC
  Administered 2020-06-01: 30 mg via INTRAMUSCULAR

## 2020-06-01 MED ORDER — CEFTRIAXONE SODIUM 1 G IJ SOLR
0.5000 g | Freq: Once | INTRAMUSCULAR | Status: AC
  Administered 2020-06-01: 0.5 g via INTRAMUSCULAR

## 2020-06-01 MED ORDER — KETOROLAC TROMETHAMINE 30 MG/ML IJ SOLN
INTRAMUSCULAR | Status: AC
  Filled 2020-06-01: qty 1

## 2020-06-01 MED ORDER — METRONIDAZOLE 500 MG PO TABS: 500.0000 mg | ORAL_TABLET | Freq: Two times a day (BID) | ORAL | 0 refills | Status: AC

## 2020-06-01 MED ORDER — DOXYCYCLINE HYCLATE 100 MG PO CAPS: 100.0000 mg | ORAL_CAPSULE | Freq: Two times a day (BID) | ORAL | 0 refills | Status: AC

## 2020-06-01 MED ORDER — LIDOCAINE HCL (PF) 1 % IJ SOLN
INTRAMUSCULAR | Status: AC
  Filled 2020-06-01: qty 2

## 2020-06-01 MED ORDER — ONDANSETRON 4 MG PO TBDP
ORAL_TABLET | ORAL | Status: AC
  Filled 2020-06-01: qty 1

## 2020-06-01 MED ORDER — ONDANSETRON 4 MG PO TBDP: 4.0000 mg | ORAL_TABLET | Freq: Three times a day (TID) | ORAL | 0 refills | Status: DC | PRN

## 2020-06-01 NOTE — ED Triage Notes (Signed)
Pt presents with abdominal pain and nausea xs 5 days. Unsure if tampon is "stuck".

## 2020-06-01 NOTE — Telephone Encounter (Signed)
Patient currently being treated at [facility]. Kinnie Feil, BSN, RN-BC

## 2020-06-01 NOTE — Telephone Encounter (Signed)
Noted. I just saw her last week for a different problem.

## 2020-06-01 NOTE — ED Provider Notes (Signed)
[facility]    CSN: [id number] Arrival date & time: 06/01/20  1103      History   Chief Complaint Chief Complaint  Patient presents with  . Abdominal Pain  . Nausea    HPI [name] is a 44 y.o. female comes to urgent care with complaints of lower abdominal pain of 5 days duration.  Patient states the pain is constant, severe, no known aggravating factors and no known relieving factors.  She denies any dysuria urgency or frequency.  No nausea vomiting.  No fever or chills.  Patient has been sexually active about 2-1/2 to 3 weeks ago.  She engaged in unprotected sexual intercourse.  She has had a history of STD in the past.  No abdominal distention. Past Medical History:  Diagnosis Date  . Acne vulgaris   . Depression   . Dyshidrotic eczema   . Gardnerella vaginitis   . Gonococcal cervicitis   . STD (female)     Patient Active Problem List   Diagnosis Date Noted  . COVID-19 05/04/2020  . Eczema 02/03/2020  . Perimenopausal vasomotor symptoms 02/03/2020  . Pelvic pain 05/22/2018  . Weight gain 05/22/2018  . High risk heterosexual behavior 03/20/2017  . Anxiety and depression 01/12/2017  . Migraine 01/12/2017  . Papanicolaou smear of cervix with positive high risk human papilloma virus (HPV) test 12/23/2015  . Tinea pedis 01/22/2008    Past Surgical History:  Procedure Laterality Date  . TUBAL LIGATION  06/2004   bilateral    OB History   No obstetric history on file.      Home Medications    Prior to Admission medications   Medication Sig Start Date End Date Taking? Authorizing Provider  doxycycline (VIBRAMYCIN) 100 MG capsule Take 1 capsule (100 mg total) by mouth 2 (two) times daily for 14 days. 06/01/20 06/15/20  Merrilee Jansky, MD  guaiFENesin (MUCINEX) 600 MG 12 hr tablet Take 1 tablet (600 mg total) by mouth 2 (two) times daily. 05/04/20 05/04/21  Dolan Amen, MD  metroNIDAZOLE (FLAGYL) 500 MG tablet Take 1 tablet (500 mg total)  by mouth 2 (two) times daily for 14 days. 06/01/20 06/15/20  Merrilee Jansky, MD  ondansetron (ZOFRAN ODT) 4 MG disintegrating tablet Take 1 tablet (4 mg total) by mouth every 8 (eight) hours as needed for nausea or vomiting. 06/01/20   Merrilee Jansky, MD  PARoxetine (PAXIL) 10 MG tablet Take 1 tablet (10 mg total) by mouth daily. Take 1 tablet (10 mg) at bedtime for 2 weeks. On 10/22, start taking 2 tablets (20 mg) at bedtime. 05/28/20 08/26/20  Steffanie Rainwater, MD  triamcinolone ointment (KENALOG) 0.5 % Apply 1 application topically 2 (two) times daily. 05/28/20 07/27/20  Steffanie Rainwater, MD  venlafaxine XR (EFFEXOR-XR) 37.5 MG 24 hr capsule Take 1 capsule (37.5 mg total) by mouth daily with breakfast. 08/05/19   Lanelle Bal, MD    Family History Family History  Problem Relation Age of Onset  . Stroke Mother        age 68 when she had the stroke  . Hyperlipidemia Father     Social History Social History   Tobacco Use  . Smoking status: Never Smoker  . Smokeless tobacco: Never Used  Substance Use Topics  . Alcohol use: No    Alcohol/week: 0.0 standard drinks  . Drug use: Never     Allergies   Patient has no known allergies.   Review of Systems Review  of Systems  Constitutional: Negative for activity change, chills, fatigue and fever.  Gastrointestinal: Positive for abdominal pain. Negative for diarrhea, nausea and vomiting.  Genitourinary: Negative.  Negative for vaginal bleeding, vaginal discharge and vaginal pain.  Neurological: Negative.   Psychiatric/Behavioral: Negative.      Physical Exam Triage Vital Signs ED Triage Vitals  Enc Vitals Group     BP 06/01/20 1353 (!) 138/93     Pulse Rate 06/01/20 1353 82     Resp 06/01/20 1353 19     Temp 06/01/20 1353 98.3 F (36.8 C)     Temp Source 06/01/20 1353 Oral     SpO2 06/01/20 1353 100 %     Weight --      Height --      Head Circumference --      Peak Flow --      Pain Score 06/01/20 1352 10       Pain Loc --      Pain Edu? --      Excl. in GC? --    No data found.  Updated Vital Signs BP (!) 138/93 (BP Location: Right Arm)   Pulse 82   Temp 98.3 F (36.8 C) (Oral)   Resp 19   LMP 05/25/2020   SpO2 100%   Visual Acuity Right Eye Distance:   Left Eye Distance:   Bilateral Distance:    Right Eye Near:   Left Eye Near:    Bilateral Near:     Physical Exam Vitals and nursing note reviewed.  Constitutional:      General: She is not in acute distress.    Appearance: She is not ill-appearing.  Abdominal:     General: Abdomen is flat. Bowel sounds are normal. There is no distension or abdominal bruit.     Palpations: Abdomen is soft. There is no shifting dullness or pulsatile mass.     Tenderness: There is abdominal tenderness in the suprapubic area. There is no right CVA tenderness or left CVA tenderness.  Genitourinary:    Vagina: No foreign body. Vaginal discharge and tenderness present. No erythema.     Cervix: No friability.     Uterus: Normal. Not deviated and not enlarged.   Neurological:     Mental Status: She is alert.      UC Treatments / Results  Labs (all labs ordered are listed, but only abnormal results are displayed) Labs Reviewed  POCT URINALYSIS DIPSTICK, ED / UC - Abnormal; Notable for the following components:      Result Value   Bilirubin Urine SMALL (*)    Ketones, ur 80 (*)    Hgb urine dipstick TRACE (*)    Protein, ur 30 (*)    Leukocytes,Ua TRACE (*)    All other components within normal limits  POC URINE PREG, ED  CERVICOVAGINAL ANCILLARY ONLY    EKG   Radiology No results found.  Procedures Procedures (including critical care time)  Medications Ordered in UC Medications  cefTRIAXone (ROCEPHIN) injection 0.5 g (has no administration in time range)  ketorolac (TORADOL) injection 30 mg (has no administration in time range)  ondansetron (ZOFRAN-ODT) disintegrating tablet 4 mg (4 mg Oral Given 06/01/20 1359)     Initial Impression / Assessment and Plan / UC Course  I have reviewed the triage vital signs and the nursing notes.  Pertinent labs & imaging results that were available during my care of the patient were reviewed by me and considered in my medical decision  making (see chart for details).     1.  Pelvic inflammatory disease: Pelvic exam is remarkable for copious yellowish discharge in the vaginal vault.  No foreign body seen in the vaginal vault Cervicovaginal swab for GC/chlamydia/trichomonas/bacterial vaginosis/vaginal yeast infection. Point-of-care urine is negative for urinary tract infection Doxycycline 100 mg twice daily for 14 days Metronidazole 500 mg twice daily for 14 days Ceftriaxone IM 500 mg x 1 dose CBC If labs are abnormal, plan of care  recommendations will be made. Return precautions given Final Clinical Impressions(s) / UC Diagnoses   Final diagnoses:  PID (acute pelvic inflammatory disease)   Discharge Instructions   None    ED Prescriptions    Medication Sig Dispense Auth. Provider   doxycycline (VIBRAMYCIN) 100 MG capsule Take 1 capsule (100 mg total) by mouth 2 (two) times daily for 14 days. 28 capsule Shahrukh Pasch, Britta Mccreedy, MD   metroNIDAZOLE (FLAGYL) 500 MG tablet Take 1 tablet (500 mg total) by mouth 2 (two) times daily for 14 days. 28 tablet Sharaya Boruff, Britta Mccreedy, MD   ondansetron (ZOFRAN ODT) 4 MG disintegrating tablet Take 1 tablet (4 mg total) by mouth every 8 (eight) hours as needed for nausea or vomiting. 20 tablet Baily Serpe, Britta Mccreedy, MD     PDMP not reviewed this encounter.   Merrilee Jansky, MD 06/01/20 1504

## 2020-06-01 NOTE — Telephone Encounter (Addendum)
Returned call to patient. States she is lying on ground outside the UC and is about to call for an ambulance. States she is still waiting for UC to call her name. Patient is not crying and is speaking in full sentences. Advised to go back inside UC and explain how she is feeling. Call placed to [facility] at [facility]. States her name was called 10-15 minutes ago but patient did not answer. She will go outside and look for patient now. Kinnie Feil, BSN, RN-BC

## 2020-06-01 NOTE — ED Notes (Signed)
Called pt x 1, no answer

## 2020-06-01 NOTE — Telephone Encounter (Signed)
Returned call to patient. States she's about to walk into UC to be seen now. Had already scheduled appt for today at 3:35. Will call back to cancel appt if not needed. Abdominal pain is RLQ and LLQ x 7 days with associated nausea and vomiting just this AM. L. Aleesia Henney, BSN, RN-BC

## 2020-06-01 NOTE — Telephone Encounter (Signed)
Requesting to speak with a nurse about having abdominal pain. Please call pt back.

## 2020-06-01 NOTE — Telephone Encounter (Signed)
Pt requesting a call back.  Pt feels like she is going to pass out, having abd pain , and throwing up.  Pt is currently @ urgent Care and also has a 3:45 pm appointment the Dr. Kirke Corin .  Please call patient back

## 2020-06-02 LAB — CERVICOVAGINAL ANCILLARY ONLY
Bacterial Vaginitis (gardnerella): POSITIVE — AB
Candida Glabrata: NEGATIVE
Candida Vaginitis: NEGATIVE
Chlamydia: NEGATIVE
Comment: NEGATIVE
Comment: NEGATIVE
Comment: NEGATIVE
Comment: NEGATIVE
Comment: NEGATIVE
Comment: NORMAL
Neisseria Gonorrhea: NEGATIVE
Trichomonas: POSITIVE — AB

## 2020-06-02 NOTE — Progress Notes (Signed)
Internal Medicine Clinic Attending  I saw and evaluated the patient.  I personally confirmed the key portions of the history and exam documented by Dr. Amponsah and I reviewed pertinent patient test results.  The assessment, diagnosis, and plan were formulated together and I agree with the documentation in the resident's note.  

## 2020-06-11 ENCOUNTER — Encounter: Payer: 59 | Admitting: Student

## 2020-06-24 ENCOUNTER — Other Ambulatory Visit: Payer: Self-pay

## 2020-06-24 ENCOUNTER — Other Ambulatory Visit (HOSPITAL_COMMUNITY)
Admission: RE | Admit: 2020-06-24 | Discharge: 2020-06-24 | Disposition: A | Discharge status: Home / Self Care | Payer: 59 | Source: Ambulatory Visit | Attending: Internal Medicine | Admitting: Internal Medicine

## 2020-06-24 ENCOUNTER — Ambulatory Visit (INDEPENDENT_AMBULATORY_CARE_PROVIDER_SITE_OTHER): Payer: 59 | Admitting: Internal Medicine

## 2020-06-24 ENCOUNTER — Encounter: Payer: Self-pay | Admitting: Internal Medicine

## 2020-06-24 VITALS — BP 122/67 | HR 94 | Temp 99.2°F | Ht 65.0 in | Wt 161.6 lb

## 2020-06-24 DIAGNOSIS — R87810 Cervical high risk human papillomavirus (HPV) DNA test positive: Secondary | ICD-10-CM | POA: Diagnosis not present

## 2020-06-24 DIAGNOSIS — N760 Acute vaginitis: Secondary | ICD-10-CM | POA: Insufficient documentation

## 2020-06-24 DIAGNOSIS — N3941 Urge incontinence: Secondary | ICD-10-CM | POA: Insufficient documentation

## 2020-06-24 MED ORDER — FLUCONAZOLE 150 MG PO TABS: 150.0000 mg | ORAL_TABLET | Freq: Once | ORAL | 0 refills | Status: AC

## 2020-06-24 NOTE — Assessment & Plan Note (Signed)
Pelvic exam and pap smear performed today.

## 2020-06-24 NOTE — Progress Notes (Signed)
   CC: Vaginal itchiness  HPI:  [name] is a 44 y.o. with a history listed below recently diagnosed with BV and trichomonas on antibiotics presenting for a general itchiness and concerns for yeast infection.  Past Medical History:  Diagnosis Date  . Acne vulgaris   . Depression   . Dyshidrotic eczema   . Gardnerella vaginitis   . Gonococcal cervicitis   . STD (female)    Review of Systems:   Constitutional: Negative for chills and fever.  Respiratory: Negative for shortness of breath.   Cardiovascular: Negative for chest pain and leg swelling.  Gastrointestinal: Negative for abdominal pain, nausea and vomiting.  Genitourinary: Positive for vaginal itchiness.  Negative for vaginal discharge, dysuria, or hematuria. Neurological: Negative for dizziness and headaches.    Physical Exam:  Vitals:   06/24/20 1407  BP: 122/67  Pulse: 94  Temp: 99.2 F (37.3 C)  TempSrc: Oral  SpO2: 100%  Weight: 161 lb 9.6 oz (73.3 kg)  Height: 5\' 5"  (1.651 m)   .Physical Exam Exam conducted with a chaperone present.  Constitutional:      Appearance: Normal appearance.  HENT:     Head: Normocephalic and atraumatic.     Mouth/Throat:     Mouth: Mucous membranes are moist.     Pharynx: Oropharynx is clear.  Cardiovascular:     Rate and Rhythm: Normal rate and regular rhythm.     Pulses: Normal pulses.     Heart sounds: Normal heart sounds.  Pulmonary:     Effort: Pulmonary effort is normal.     Breath sounds: Normal breath sounds.  Abdominal:     General: Abdomen is flat. Bowel sounds are normal.     Palpations: Abdomen is soft.     Tenderness: There is no abdominal tenderness. There is no guarding or rebound.  Genitourinary:    General: Normal vulva.     Labia:        Right: No lesion.      Vagina: Vaginal discharge (Whitish) present. No erythema.     Cervix: No friability or erythema.     Adnexa: Right adnexa normal and left adnexa normal.  Skin:    Capillary  Refill: Capillary refill takes less than 2 seconds.  Neurological:     General: No focal deficit present.     Mental Status: She is alert and oriented to person, place, and time.  Psychiatric:        Mood and Affect: Mood normal.        Behavior: Behavior normal.    Assessment & Plan:   See Encounters Tab for problem based charting.  Patient discussed with Dr. 

## 2020-06-24 NOTE — Assessment & Plan Note (Signed)
Patient reports episodes increased urinary frequency and urgency, she states over the past year she has increased urgency to use the restroom, has not had any incontinence episodes however does get worried.  She denies any leakage with stress, coughing, sneezing, or exercise.  She has had 2 vaginal deliveries.  Appears to be consistent with urge incontinence.  Provided information on Kegel exercises.  Patient declined printout of information.

## 2020-06-24 NOTE — Patient Instructions (Signed)
Ms. MODENA BELLEMARE,  It was a pleasure to see you today. Thank you for coming in.   Today we discussed your concern about a yeast infection. In regards to this I have sent some tests to evaluate and I will contact you with the results. You can take the Diflucan for the yeast infection.   We also discussed your urge to urinate. This seems to be urge incontinence. You can try kegal exercises to see if this helps.   Please return to clinic in 6 months or sooner if needed.   Thank you again for coming in.   Claudean Severance.D.

## 2020-06-24 NOTE — Assessment & Plan Note (Addendum)
Patient was seen on 10/11 in the urgent care for complaints of lower abdominal pain, following unprotected sex, diagnosed with PID and given a prescription for doxycycline, metronidazole, and received a dose of ceftriaxone.  Wet prep came back positive for trichomonas and bacterial vaginitis.  She states that she has difficulty taking antibiotics and needed to take them one at a time, she finished the Flagyl approximately 1 week ago and has not started the doxycycline.  About 2 days ago she started having some vaginal itchiness and vaginal swelling, she denies any vaginal pain, abdominal pain, discharge, nausea, vomiting, fever, chills, headache, chest pain, shortness of breath, dysuria, hematuria, or other symptoms. On exam she has no abdominal tenderness or vaginal tenderness, cervical exam showed whitish discharge, no cervical erythema or edema noted.  Performed wet prep and Pap smear. Exam and symptoms appear consistent with a yeast infection.  We will treat empirically with Diflucan.  We will repeat wet prep to evaluate for trichomonas and BV.  Advised patient to continue doxycycline for now.  -Diflucan 150 mg once -Wet prep performed -Pap smear performed  Addendum: Pap smear came back positive for BV and candida. Contacted patient regarding results. Discussed doing a course of clindamycin vs vaginal metronidazole. Decided to do 7 day course of clindamycin. GC and chlamydia came back negative, advised to stop taking doxycycline.   -Clindamycin 300 mg BID x7 days -Stop doxycycline

## 2020-06-25 LAB — CERVICOVAGINAL ANCILLARY ONLY
Bacterial Vaginitis (gardnerella): POSITIVE — AB
Candida Glabrata: NEGATIVE
Candida Vaginitis: POSITIVE — AB
Chlamydia: NEGATIVE
Comment: NEGATIVE
Comment: NEGATIVE
Comment: NEGATIVE
Comment: NEGATIVE
Comment: NEGATIVE
Comment: NORMAL
Neisseria Gonorrhea: NEGATIVE
Trichomonas: NEGATIVE

## 2020-06-26 MED ORDER — CLINDAMYCIN HCL 300 MG PO CAPS: 300.0000 mg | ORAL_CAPSULE | Freq: Two times a day (BID) | ORAL | 0 refills | Status: DC

## 2020-06-26 NOTE — Addendum Note (Signed)
Addended by: Claudean Severance on: 06/26/2020 09:49 AM   Modules accepted: Orders

## 2020-06-30 LAB — CYTOLOGY - PAP
Adequacy: ABSENT
Comment: NEGATIVE
Diagnosis: NEGATIVE
High risk HPV: NEGATIVE

## 2020-07-03 NOTE — Progress Notes (Signed)
Internal Medicine Clinic Attending ° °Case discussed with Dr. Krienke  At the time of the visit.  We reviewed the resident’s history and exam and pertinent patient test results.  I agree with the assessment, diagnosis, and plan of care documented in the resident’s note.  °

## 2020-07-21 ENCOUNTER — Other Ambulatory Visit: Payer: Self-pay

## 2020-07-21 ENCOUNTER — Encounter (HOSPITAL_COMMUNITY): Payer: Self-pay | Admitting: *Deleted

## 2020-07-21 ENCOUNTER — Ambulatory Visit (HOSPITAL_COMMUNITY)
Admission: EM | Admit: 2020-07-21 | Discharge: 2020-07-21 | Disposition: A | Discharge status: Home / Self Care | Payer: 59 | Attending: Family Medicine | Admitting: Family Medicine

## 2020-07-21 DIAGNOSIS — M79644 Pain in right finger(s): Secondary | ICD-10-CM

## 2020-07-21 MED ORDER — DOXYCYCLINE HYCLATE 100 MG PO CAPS: 100.0000 mg | ORAL_CAPSULE | Freq: Two times a day (BID) | ORAL | 0 refills | Status: DC

## 2020-07-21 MED ORDER — HYDROCODONE-ACETAMINOPHEN 5-325 MG PO TABS: 1.0000 | ORAL_TABLET | Freq: Four times a day (QID) | ORAL | 0 refills | Status: DC | PRN

## 2020-07-21 NOTE — ED Provider Notes (Signed)
[facility]   [id number] 07/21/20 Arrival Time: 2355  ASSESSMENT & PLAN:  1. Pain of right thumb     No indication for imaging. Discussed possibility of forming paronychia. Declines I&D attempt today. Prefers trial of antibiotic first.  Begin: Meds ordered this encounter  Medications  . HYDROcodone-acetaminophen (NORCO/VICODIN) 5-325 MG tablet    Sig: Take 1 tablet by mouth every 6 (six) hours as needed for moderate pain or severe pain.    Dispense:  6 tablet    Refill:  0  . doxycycline (VIBRAMYCIN) 100 MG capsule    Sig: Take 1 capsule (100 mg total) by mouth 2 (two) times daily.    Dispense:  14 capsule    Refill:  0    Recommend:  Follow-up Information    [facility].   Specialty: Urgent Care Why: If worsening or failing to improve as anticipated. Contact information: [address] [place] [address] [phone]               [place] Controlled Substances Registry consulted for this patient. I feel the risk/benefit ratio today is favorable for proceeding with this prescription for a controlled substance. Medication sedation precautions given.  Reviewed expectations re: course of current medical issues. Questions answered. Outlined signs and symptoms indicating need for more acute intervention. Patient verbalized understanding. After Visit Summary given.  SUBJECTIVE: History from: patient. [name] is a 44 y.o. female who reports R thumb pain at nail s/p opening a box with said nail 4 d ago. Gradually has become sore/painful. No extremity sensation changes or weakness. No bleeding or drainage. Afebrile. OTC analgesics without relief.  Past Surgical History:  Procedure Laterality Date  . TUBAL LIGATION  06/2004   bilateral      OBJECTIVE:  Vitals:   07/21/20 1008 07/21/20 1011  BP: 119/78   Pulse: 81   Resp: 16   Temp: 98.4 F (36.9 C)   TempSrc: Oral   SpO2: 100%   Weight:  70.3 kg    Height:  5\' 4"  (1.626 m)    General appearance: alert; no distress HEENT: [place]; AT Neck: supple with FROM Resp: unlabored respirations Extremities: . R thumb: warm with well perfused appearance; very TTP around distal nail without frank paronychia present; no open skin/wounds; FROM; normal strength Skin: warm and dry; no visible rashes Psychological: alert and cooperative; normal mood and affect    No Known Allergies  Past Medical History:  Diagnosis Date  . Acne vulgaris   . Depression   . Dyshidrotic eczema   . Gardnerella vaginitis   . Gonococcal cervicitis   . STD (female)    Social History   Socioeconomic History  . Marital status: Single    Spouse name: Not on file  . Number of children: Not on file  . Years of education: Not on file  . Highest education level: Not on file  Occupational History  . Not on file  Tobacco Use  . Smoking status: Never Smoker  . Smokeless tobacco: Never Used  Substance and Sexual Activity  . Alcohol use: Yes    Alcohol/week: 0.0 standard drinks    Comment: Occasionally.  . Drug use: Never  . Sexual activity: Not on file  Other Topics Concern  . Not on file  Social History Narrative   Has 2 children.   Father's children passed away from ruptured aneurysm in 2005.   Works [place] [employer].   Social Determinants  of Health   Financial Resource Strain:   . Difficulty of Paying Living Expenses: Not on file  Food Insecurity:   . Worried About Programme researcher, broadcasting/film/video in the Last Year: Not on file  . Ran Out of Food in the Last Year: Not on file  Transportation Needs:   . Lack of Transportation (Medical): Not on file  . Lack of Transportation (Non-Medical): Not on file  Physical Activity:   . Days of Exercise per Week: Not on file  . Minutes of Exercise per Session: Not on file  Stress:   . Feeling of Stress : Not on file  Social Connections:   . Frequency of Communication with Friends and Family: Not on file  . Frequency of Social  Gatherings with Friends and Family: Not on file  . Attends Religious Services: Not on file  . Active Member of Clubs or Organizations: Not on file  . Attends Banker Meetings: Not on file  . Marital Status: Not on file   Family History  Problem Relation Age of Onset  . Stroke Mother        age 64 when she had the stroke  . Hyperlipidemia Father    Past Surgical History:  Procedure Laterality Date  . TUBAL LIGATION  06/2004   bilateral      Mardella Layman, MD 07/21/20 1042

## 2020-07-21 NOTE — ED Triage Notes (Signed)
Pr reports cutting her RT thumb 4 days ago . RT thumb is no swollen and limited movement. Pt reports feeling discomfort up RT arm.

## 2020-07-21 NOTE — Discharge Instructions (Signed)

## 2020-07-23 ENCOUNTER — Other Ambulatory Visit: Payer: Self-pay | Admitting: Internal Medicine

## 2020-07-23 ENCOUNTER — Ambulatory Visit (INDEPENDENT_AMBULATORY_CARE_PROVIDER_SITE_OTHER): Payer: 59 | Admitting: Internal Medicine

## 2020-07-23 ENCOUNTER — Ambulatory Visit (HOSPITAL_COMMUNITY)
Admission: RE | Admit: 2020-07-23 | Discharge: 2020-07-23 | Disposition: A | Discharge status: Home / Self Care | Payer: 59 | Source: Ambulatory Visit | Attending: Internal Medicine | Admitting: Internal Medicine

## 2020-07-23 ENCOUNTER — Other Ambulatory Visit: Payer: Self-pay

## 2020-07-23 VITALS — BP 126/84 | HR 91 | Temp 98.2°F | Wt 165.2 lb

## 2020-07-23 DIAGNOSIS — L03011 Cellulitis of right finger: Secondary | ICD-10-CM

## 2020-07-23 DIAGNOSIS — L03012 Cellulitis of left finger: Secondary | ICD-10-CM

## 2020-07-23 MED ORDER — AMOXICILLIN-POT CLAVULANATE 875-125 MG PO TABS: 1.0000 | ORAL_TABLET | Freq: Two times a day (BID) | ORAL | 0 refills | Status: AC

## 2020-07-23 MED ORDER — HYDROCODONE-ACETAMINOPHEN 5-325 MG PO TABS: 1.0000 | ORAL_TABLET | Freq: Three times a day (TID) | ORAL | 0 refills | Status: AC | PRN

## 2020-07-23 NOTE — Progress Notes (Signed)
   CC: thumb infection  HPI:  [name] is a 44 y.o. with PMH as below.   Please see A&P for assessment of the patient's acute and chronic medical conditions.   She injured her thumbnail and surrounding skin around 10 days ago while trying to open a package using her thumbnail. Since then she's developed worsening severe pain and swelling that now extends to the proximal thumb joint and is causing hand pain. She denies fever, chills, nausea, or vomiting. There has been no drainage.  She went to urgent care three days ago and was given doxycycline and hydrocodone but the pain and swelling has only gotten worse.  On PE there is swelling to the proximal thumb joint, very TTP, thumb pad with skin changes and fluctuance.    Past Medical History:  Diagnosis Date  . Acne vulgaris   . Depression   . Dyshidrotic eczema   . Gardnerella vaginitis   . Gonococcal cervicitis   . STD (female)    Review of Systems:   10 point ROS negative except as noted in HPI  Physical Exam:  Constitution: NAD, appears stated age Cardio: RRR, no m/r/g, no LE edema  Respiratory: CTA, no w/r/r MSK: right thumb with swelling and fluctuance, limited ROM and very TTP  Neuro: normal affect, a&ox3 Skin: c/d/i    Vitals:   07/23/20 1312  BP: 126/84  Pulse: 91  Temp: 98.2 F (36.8 C)  TempSrc: Oral  SpO2: 100%  Weight: 165 lb 3.2 oz (74.9 kg)     Assessment & Plan:   See Encounters Tab for problem based charting.  Patient discussed with Dr. Mayford Knife

## 2020-07-23 NOTE — Patient Instructions (Signed)
Thank you for allowing [place] to provide your care today. Today we discussed infection of your left thumb  Please continue the doxycycline.    Please start taking Augmentin every 12 hours for the next five days.   Please soak you thumb in warm epsom salt frequently throughout the day to help the area drain.   Please follow-up Monday, December 6th.   If symptoms continue to worsen please go to the ER.   Should you have any questions or concerns please call the internal medicine clinic at [phone].

## 2020-07-23 NOTE — Progress Notes (Deleted)
   CC: ***  HPI:  [name] is a 44 y.o. with PMH as below.   Please see A&P for assessment of the patient's acute and chronic medical conditions.   Past Medical History:  Diagnosis Date  . Acne vulgaris   . Depression   . Dyshidrotic eczema   . Gardnerella vaginitis   . Gonococcal cervicitis   . STD (female)    Review of Systems:  *** 10 point ROS negative except as noted in HPI  Physical Exam:   Constitution: NAD, appears stated age HENT: Eyes:  Cardio: RRR, no m/r/g, no LE edema  Respiratory: CTA, no w/r/r Abdominal: NTTP, soft, non-distended MSK: moving all extremities Neuro: normal affect, a&ox3 GU: Skin: c/d/i    There were no vitals filed for this visit. ***  Assessment & Plan:   See Encounters Tab for problem based charting.  Patient {GC/GE:3044014::"discussed with","seen with"} Dr. {NAMES:[id number]::"Butcher","Granfortuna","E. Hoffman","Klima","Mullen","Narendra","Raines","Vincent"}

## 2020-07-24 ENCOUNTER — Other Ambulatory Visit: Payer: 59

## 2020-07-24 DIAGNOSIS — L03011 Cellulitis of right finger: Secondary | ICD-10-CM | POA: Insufficient documentation

## 2020-07-24 LAB — CBC WITH DIFFERENTIAL/PLATELET
Basophils Absolute: 0 10*3/uL (ref 0.0–0.2)
Basos: 1 %
EOS (ABSOLUTE): 0.1 10*3/uL (ref 0.0–0.4)
Eos: 2 %
Hematocrit: 36.6 % (ref 34.0–46.6)
Hemoglobin: 12 g/dL (ref 11.1–15.9)
Immature Grans (Abs): 0 10*3/uL (ref 0.0–0.1)
Immature Granulocytes: 0 %
Lymphocytes Absolute: 1.8 10*3/uL (ref 0.7–3.1)
Lymphs: 42 %
MCH: 29.4 pg (ref 26.6–33.0)
MCHC: 32.8 g/dL (ref 31.5–35.7)
MCV: 90 fL (ref 79–97)
Monocytes Absolute: 0.3 10*3/uL (ref 0.1–0.9)
Monocytes: 8 %
Neutrophils Absolute: 2 10*3/uL (ref 1.4–7.0)
Neutrophils: 47 %
Platelets: 160 10*3/uL (ref 150–450)
RBC: 4.08 x10E6/uL (ref 3.77–5.28)
RDW: 12.7 % (ref 11.7–15.4)
WBC: 4.3 10*3/uL (ref 3.4–10.8)

## 2020-07-24 LAB — C-REACTIVE PROTEIN: CRP: 2 mg/L (ref 0–10)

## 2020-07-24 NOTE — Assessment & Plan Note (Addendum)
She injured her thumbnail and surrounding skin around 10 days ago while trying to open a package using her thumbnail. Since then she's developed worsening severe pain and swelling that now extends to the proximal thumb joint and is causing hand pain. She denies fever, chills, nausea, or vomiting. There has been no drainage.  She went to urgent care three days ago and was given doxycycline and hydrocodone but the pain and swelling has only gotten worse.  On PE there is swelling to the proximal thumb joint, very TTP, thumb pad with skin changes and fluctuance.   - CRP, CBC - XR thumb - continue doxycycline, add augmentin for anaerobic coverage  - unable to be seen by orthopedics today outpatient, advised to go to the ER if swelling or pain worsens and no improvement in the next two days  - soak thumb in warm epsom salt several times throughout the day   ADDENDUM: labs normal. XR without inflammation or abscess seen. She states pain and swelling have slightly improved. Will f/u Monday.

## 2020-07-27 ENCOUNTER — Encounter: Payer: 59 | Admitting: Student

## 2020-07-31 NOTE — Progress Notes (Signed)
Internal Medicine Clinic Attending  I saw and evaluated the patient.  I personally confirmed the key portions of the history and exam documented by Dr. Seawell and I reviewed pertinent patient test results.  The assessment, diagnosis, and plan were formulated together and I agree with the documentation in the resident's note.     

## 2021-01-24 ENCOUNTER — Encounter: Payer: Self-pay | Admitting: *Deleted

## 2021-01-26 ENCOUNTER — Other Ambulatory Visit: Payer: Self-pay | Admitting: Student

## 2021-01-26 DIAGNOSIS — L309 Dermatitis, unspecified: Secondary | ICD-10-CM

## 2021-02-23 ENCOUNTER — Encounter: Payer: Self-pay | Admitting: *Deleted

## 2021-03-26 ENCOUNTER — Telehealth: Payer: Self-pay

## 2021-03-26 NOTE — Telephone Encounter (Signed)
Pt is requesting  call back .. she is requesting a cream for her eczema which is not on her list when asked if she had gotten the medication from [place] she stated yes

## 2021-03-26 NOTE — Telephone Encounter (Signed)
Returned call to patient. She is requesting refill on triamcinolone cream for eczema on her foot. No longer on current med list. Please see note from 05/28/20.

## 2021-03-27 ENCOUNTER — Other Ambulatory Visit: Payer: Self-pay | Admitting: Student

## 2021-03-27 DIAGNOSIS — L309 Dermatitis, unspecified: Secondary | ICD-10-CM

## 2021-03-27 MED ORDER — TRIAMCINOLONE ACETONIDE 0.5 % EX OINT: 1.0000 "application " | TOPICAL_OINTMENT | Freq: Two times a day (BID) | CUTANEOUS | 0 refills | Status: DC

## 2021-09-03 ENCOUNTER — Ambulatory Visit (INDEPENDENT_AMBULATORY_CARE_PROVIDER_SITE_OTHER): Payer: 59 | Admitting: Internal Medicine

## 2021-09-03 ENCOUNTER — Other Ambulatory Visit (HOSPITAL_COMMUNITY)
Admission: RE | Admit: 2021-09-03 | Discharge: 2021-09-03 | Disposition: A | Discharge status: Home / Self Care | Payer: 59 | Source: Ambulatory Visit | Attending: Internal Medicine | Admitting: Internal Medicine

## 2021-09-03 VITALS — BP 123/72 | HR 76 | Temp 97.8°F | Wt 166.4 lb

## 2021-09-03 DIAGNOSIS — R232 Flushing: Secondary | ICD-10-CM

## 2021-09-03 DIAGNOSIS — L309 Dermatitis, unspecified: Secondary | ICD-10-CM | POA: Diagnosis not present

## 2021-09-03 DIAGNOSIS — R87810 Cervical high risk human papillomavirus (HPV) DNA test positive: Secondary | ICD-10-CM | POA: Insufficient documentation

## 2021-09-03 DIAGNOSIS — F32A Depression, unspecified: Secondary | ICD-10-CM

## 2021-09-03 DIAGNOSIS — F419 Anxiety disorder, unspecified: Secondary | ICD-10-CM | POA: Diagnosis not present

## 2021-09-03 MED ORDER — TRAZODONE HCL 50 MG PO TABS: 50.0000 mg | ORAL_TABLET | Freq: Every day | ORAL | 0 refills | Status: DC

## 2021-09-03 MED ORDER — VENLAFAXINE HCL ER 37.5 MG PO CP24: 37.5000 mg | ORAL_CAPSULE | Freq: Every day | ORAL | 1 refills | Status: DC

## 2021-09-03 MED ORDER — TRIAMCINOLONE ACETONIDE 0.5 % EX OINT: 1.0000 "application " | TOPICAL_OINTMENT | Freq: Two times a day (BID) | CUTANEOUS | 0 refills | Status: DC

## 2021-09-03 NOTE — Patient Instructions (Addendum)
Anxiety, Trouble Sleeping, Hot Flashes -Start Effexor. This medicine is designed to help your mood but is also known to help with hot flashes! Please arrange a telehealth with our office in 6-8 weeks to follow up on symptoms.  -Start trazodone. This medicine is for sleep. You can start with 1 tablet at night but may increase to two tablets if needed. It can take 30-45 minutes to work. -We also have a licensed therapist in our office. If you wish to make an appointment with her, please notify our office.   Eczema, Acne scarring -I have placed a referral to dermatology and sent a refill of the steroid cream to your pharmacy

## 2021-09-05 ENCOUNTER — Encounter: Payer: Self-pay | Admitting: Internal Medicine

## 2021-09-05 NOTE — Assessment & Plan Note (Signed)
She reports mild improvement with kenalog cream although does not fully resolve symptoms. She agrees to dermatology referral for further evaluation of this as well as to discuss treatment options for acne scars on her face.

## 2021-09-05 NOTE — Assessment & Plan Note (Signed)
Pap smear repeated in the office today.
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
## 2021-09-06 LAB — CERVICOVAGINAL ANCILLARY ONLY
Chlamydia: NEGATIVE
Comment: NEGATIVE
Comment: NEGATIVE
Comment: NORMAL
Neisseria Gonorrhea: NEGATIVE
Trichomonas: NEGATIVE

## 2021-09-07 LAB — CYTOLOGY - PAP
Adequacy: ABSENT
Comment: NEGATIVE
Diagnosis: NEGATIVE
High risk HPV: NEGATIVE

## 2021-09-08 NOTE — Progress Notes (Signed)
Internal Medicine Clinic Attending  Case discussed with Dr. Christian  At the time of the visit.  We reviewed the resident's history and exam and pertinent patient test results.  I agree with the assessment, diagnosis, and plan of care documented in the resident's note.  

## 2021-11-01 ENCOUNTER — Other Ambulatory Visit: Payer: Self-pay

## 2021-11-01 DIAGNOSIS — F419 Anxiety disorder, unspecified: Secondary | ICD-10-CM

## 2021-11-01 MED ORDER — TRAZODONE HCL 50 MG PO TABS: 50.0000 mg | ORAL_TABLET | Freq: Every day | ORAL | 0 refills | Status: DC

## 2021-11-02 ENCOUNTER — Other Ambulatory Visit: Payer: Self-pay

## 2021-11-02 DIAGNOSIS — L309 Dermatitis, unspecified: Secondary | ICD-10-CM

## 2021-11-02 MED ORDER — TRIAMCINOLONE ACETONIDE 0.5 % EX OINT: 1.0000 "application " | TOPICAL_OINTMENT | Freq: Two times a day (BID) | CUTANEOUS | 0 refills | Status: DC

## 2021-11-08 ENCOUNTER — Ambulatory Visit (INDEPENDENT_AMBULATORY_CARE_PROVIDER_SITE_OTHER): Payer: 59 | Admitting: Internal Medicine

## 2021-11-08 DIAGNOSIS — F419 Anxiety disorder, unspecified: Secondary | ICD-10-CM

## 2021-11-08 DIAGNOSIS — F32A Depression, unspecified: Secondary | ICD-10-CM | POA: Diagnosis not present

## 2021-11-08 MED ORDER — TRAZODONE HCL 50 MG PO TABS: 50.0000 mg | ORAL_TABLET | Freq: Every day | ORAL | 2 refills | Status: DC

## 2021-11-08 MED ORDER — VENLAFAXINE HCL ER 75 MG PO CP24: 75.0000 mg | ORAL_CAPSULE | Freq: Every day | ORAL | 2 refills | Status: DC

## 2021-11-08 NOTE — Assessment & Plan Note (Addendum)
Medications:Effexor started 01/23, 37.5 mg; Trazodone 50 mg ?Patient states that she feels like the medication has been helpful. She states that she feels less anxious and has been sleeping better. She has been taking trazodone as needed to help her sleep and finds this helpful. She is interested in trying a higher dose of effexor. She states that her hot flash symptoms have improved as well.  ?P: ?Increase Effexor from 37.5 to 75 mg qd ?Continue trazodone 50 mg PRN ?

## 2021-11-08 NOTE — Progress Notes (Signed)
? ?  I connected with  [name] on 11/08/21 by telephone and verified that I am speaking with the correct person using two identifiers. ?  ?I discussed the limitations of evaluation and management by telemedicine. The patient expressed understanding and agreed to proceed. ? ?CC: anxiety/ depression ? ?This is a telephone encounter between [name] and [name] on 11/08/2021 for anxiety/ depression. The visit was conducted with the patient located at home and Rudene Christians at [facility]. The patient's identity was confirmed using their DOB and current address. The patient has consented to being evaluated through a telephone encounter and understands the associated risks (an examination cannot be done and the patient may need to come in for an appointment) / benefits (allows the patient to remain at home, decreasing exposure to coronavirus). I personally spent 10 minutes on medical discussion.  ? ?HPI: ? ?[name] is a 46 y.o. with PMH as below.  ? ?Please see A&P for assessment of the patient's acute and chronic medical conditions.  ? ?Past Medical History:  ?Diagnosis Date  ? Acne vulgaris   ? Depression   ? Dyshidrotic eczema   ? Gardnerella vaginitis   ? Gonococcal cervicitis   ? STD (female)   ? ?Review of Systems:  endorses decrease in hot flashes ? ?Assessment & Plan:  ? ?Anxiety and depression ?Medications:Effexor started 01/23, 37.5 mg; Trazodone 50 mg ?Patient states that she feels like the medication has been helpful. She states that she feels less anxious and has been sleeping better. She has been taking trazodone as needed to help her sleep and finds this helpful. She is interested in trying a higher dose of effexor. She states that her hot flash symptoms have improved as well.  ?P: ?Increase Effexor from 37.5 to 75 mg qd ?Continue trazodone 50 mg PRN  ? ?Patient discussed with Dr. Mayford Knife ? ?Rudene Christians, D.O. ?[facility] Internal [facility]  PGY-1 ?Pager: [phone]  Phone:  [phone] ?Date 11/08/2021  Time 2:02 PM  ? ? ?

## 2021-11-23 NOTE — Progress Notes (Signed)
Internal Medicine Clinic Attending  Case discussed with Dr. Masters  At the time of the visit.  We reviewed the resident's history and exam and pertinent patient test results.  I agree with the assessment, diagnosis, and plan of care documented in the resident's note.  

## 2021-12-28 ENCOUNTER — Other Ambulatory Visit: Payer: Self-pay

## 2021-12-28 DIAGNOSIS — L309 Dermatitis, unspecified: Secondary | ICD-10-CM

## 2021-12-28 MED ORDER — TRIAMCINOLONE ACETONIDE 0.5 % EX OINT: 1.0000 "application " | TOPICAL_OINTMENT | Freq: Two times a day (BID) | CUTANEOUS | 0 refills | Status: DC

## 2022-03-10 ENCOUNTER — Other Ambulatory Visit: Payer: Self-pay

## 2022-03-10 DIAGNOSIS — L309 Dermatitis, unspecified: Secondary | ICD-10-CM

## 2022-03-10 MED ORDER — TRIAMCINOLONE ACETONIDE 0.5 % EX OINT: 1.0000 | TOPICAL_OINTMENT | Freq: Two times a day (BID) | CUTANEOUS | 2 refills | Status: DC

## 2022-04-19 ENCOUNTER — Other Ambulatory Visit: Payer: Self-pay | Admitting: *Deleted

## 2022-04-19 DIAGNOSIS — F419 Anxiety disorder, unspecified: Secondary | ICD-10-CM

## 2022-04-19 MED ORDER — VENLAFAXINE HCL ER 75 MG PO CP24: 75.0000 mg | ORAL_CAPSULE | Freq: Every day | ORAL | 2 refills | Status: DC

## 2022-04-27 ENCOUNTER — Encounter: Payer: 59 | Admitting: Internal Medicine

## 2022-05-05 ENCOUNTER — Ambulatory Visit (INDEPENDENT_AMBULATORY_CARE_PROVIDER_SITE_OTHER): Payer: Commercial Managed Care - HMO | Admitting: Internal Medicine

## 2022-05-05 ENCOUNTER — Encounter: Payer: Self-pay | Admitting: Internal Medicine

## 2022-05-05 VITALS — BP 117/71 | HR 90 | Temp 98.0°F | Ht 64.0 in | Wt 171.2 lb

## 2022-05-05 DIAGNOSIS — F419 Anxiety disorder, unspecified: Secondary | ICD-10-CM | POA: Diagnosis not present

## 2022-05-05 DIAGNOSIS — R3589 Other polyuria: Secondary | ICD-10-CM | POA: Diagnosis not present

## 2022-05-05 DIAGNOSIS — Z13220 Encounter for screening for lipoid disorders: Secondary | ICD-10-CM | POA: Diagnosis not present

## 2022-05-05 DIAGNOSIS — L309 Dermatitis, unspecified: Secondary | ICD-10-CM | POA: Diagnosis not present

## 2022-05-05 DIAGNOSIS — Z139 Encounter for screening, unspecified: Secondary | ICD-10-CM

## 2022-05-05 DIAGNOSIS — F32A Depression, unspecified: Secondary | ICD-10-CM

## 2022-05-05 DIAGNOSIS — Z0000 Encounter for general adult medical examination without abnormal findings: Secondary | ICD-10-CM

## 2022-05-05 LAB — POCT GLYCOSYLATED HEMOGLOBIN (HGB A1C): Hemoglobin A1C: 5.5 % (ref 4.0–5.6)

## 2022-05-05 LAB — GLUCOSE, CAPILLARY: Glucose-Capillary: 90 mg/dL (ref 70–99)

## 2022-05-05 MED ORDER — VENLAFAXINE HCL ER 37.5 MG PO CP24: 37.5000 mg | ORAL_CAPSULE | Freq: Every day | ORAL | 0 refills | Status: DC

## 2022-05-05 MED ORDER — TRAZODONE HCL 50 MG PO TABS: 50.0000 mg | ORAL_TABLET | Freq: Every day | ORAL | 2 refills | Status: DC

## 2022-05-05 MED ORDER — TRIAMCINOLONE ACETONIDE 0.1 % EX CREA: 1.0000 | TOPICAL_CREAM | Freq: Two times a day (BID) | CUTANEOUS | 0 refills | Status: DC

## 2022-05-05 NOTE — Patient Instructions (Addendum)
Ms.Holley L Sieh, it was a pleasure seeing you today! You endorsed feeling well today. Below are some of the things we talked about this visit. We look forward to seeing you in the follow up appointment!  Today we discussed: We will do some lab testing today. I will call you with the results.  I ordered a mammogram as well. Please get that done.   I will refill your prescriptions.   I have ordered the following labs today:  Lab Orders         Basic metabolic panel         Lipid Profile         POC Hbg A1C       Referrals ordered today:   Referral Orders  No referral(s) requested today     I have ordered the following medication/changed the following medications:   Stop the following medications: Medications Discontinued During This Encounter  Medication Reason   triamcinolone ointment (KENALOG) 0.5 % Change in therapy   traZODone (DESYREL) 50 MG tablet Reorder   venlafaxine XR (EFFEXOR XR) 75 MG 24 hr capsule      Start the following medications: Meds ordered this encounter  Medications   venlafaxine XR (EFFEXOR XR) 37.5 MG 24 hr capsule    Sig: Take 1 capsule (37.5 mg total) by mouth daily.    Dispense:  90 capsule    Refill:  0   triamcinolone cream (KENALOG) 0.1 %    Sig: Apply 1 Application topically 2 (two) times daily.    Dispense:  30 g    Refill:  0   traZODone (DESYREL) 50 MG tablet    Sig: Take 1 tablet (50 mg total) by mouth at bedtime. Take 1-2 tablets at night as needed for sleep    Dispense:  90 tablet    Refill:  2     Follow-up: 3 month follow up  Please make sure to arrive 15 minutes prior to your next appointment. If you arrive late, you may be asked to reschedule.   We look forward to seeing you next time. Please call our clinic at [phone] if you have any questions or concerns. The best time to call is Monday-Friday from 9am-4pm, but there is someone available 24/7. If after hours or the weekend, call the main hospital number and ask for  the Internal Medicine Resident On-Call. If you need medication refills, please notify your pharmacy one week in advance and they will send [place] a request.  Thank you for letting us take part in your care. Wishing you the best!  Thank you, Gwenevere Abbot, MD

## 2022-05-05 NOTE — Progress Notes (Unsigned)
   CC: follow up.   HPI:  [name] is a 46 y.o. with medical history of eczema, anxiety and depression presenting to [facility] for follow up.   Please see problem-based list for further details, assessments, and plans.  Past Medical History:  Diagnosis Date   Acne vulgaris    Depression    Dyshidrotic eczema    Gardnerella vaginitis    Gonococcal cervicitis    STD (female)          Current Outpatient Medications (Other):    traZODone (DESYREL) 50 MG tablet, Take 1 tablet (50 mg total) by mouth at bedtime. Take 1-2 tablets at night as needed for sleep   triamcinolone ointment (KENALOG) 0.5 %, Apply 1 Application topically 2 (two) times daily.   venlafaxine XR (EFFEXOR XR) 75 MG 24 hr capsule, Take 1 capsule (75 mg total) by mouth daily.  Review of Systems:  Review of system negative unless stated in the problem list or HPI.    Physical Exam:  Vitals:   05/05/22 1010  BP: 117/71  Pulse: 90  Temp: 98 F (36.7 C)  TempSrc: Oral  SpO2: 100%  Weight: 171 lb 3.2 oz (77.7 kg)  Height: 5\' 4"  (1.626 m)    Physical Exam General: NAD HENT: NCAT Lungs: CTAB, no wheeze, rhonchi or rales.  Cardiovascular: Normal heart sounds, no r/m/g, 2+ pulses in all extremities. No LE edema Abdomen: No TTP, normal bowel sounds MSK: No asymmetry or muscle atrophy.  Skin: no lesions noted on exposed skin Neuro: Alert and oriented x4. CN grossly intact Psych: Normal mood and normal affect   Assessment & Plan:   No problem-specific Assessment & Plan notes found for this encounter.   See Encounters Tab for problem based charting.  Patient discussed with Dr. {NAMES:[id number]::"Guilloud","Hoffman","Mullen","Narendra","Vincent","Machen","Lau","Hatcher"} , MD Gwenevere Abbot. [facility] Internal Medicine Residency, PGY-2   Flashes/MDD PHQ 9 was on effexor but not any more  Preventive  Flu shot

## 2022-05-06 LAB — BASIC METABOLIC PANEL
BUN/Creatinine Ratio: 17 (ref 9–23)
BUN: 13 mg/dL (ref 6–24)
CO2: 24 mmol/L (ref 20–29)
Calcium: 9.6 mg/dL (ref 8.7–10.2)
Chloride: 103 mmol/L (ref 96–106)
Creatinine, Ser: 0.78 mg/dL (ref 0.57–1.00)
Glucose: 82 mg/dL (ref 70–99)
Potassium: 5.1 mmol/L (ref 3.5–5.2)
Sodium: 141 mmol/L (ref 134–144)
eGFR: 97 mL/min/{1.73_m2} (ref >59)

## 2022-05-06 LAB — LIPID PANEL
Chol/HDL Ratio: 2.2 ratio (ref 0.0–4.4)
Cholesterol, Total: 194 mg/dL (ref 100–199)
HDL: 89 mg/dL (ref >39)
LDL Chol Calc (NIH): 95 mg/dL (ref 0–99)
Triglycerides: 53 mg/dL (ref 0–149)
VLDL Cholesterol Cal: 10 mg/dL (ref 5–40)

## 2022-05-06 MED ORDER — TRIAMCINOLONE ACETONIDE 0.5 % EX OINT: 1.0000 | TOPICAL_OINTMENT | Freq: Two times a day (BID) | CUTANEOUS | 2 refills | Status: DC

## 2022-05-07 NOTE — Progress Notes (Incomplete)
   CC: follow up   HPI:  [name] is a 46 y.o. with medical history of eczema, anxiety and depression presenting to [facility] for follow up.   Please see problem-based list for further details, assessments, and plans.  Past Medical History:  Diagnosis Date  . Acne vulgaris   . Depression   . Dyshidrotic eczema   . Gardnerella vaginitis   . Gonococcal cervicitis   . STD (female)          Current Outpatient Medications (Other):  .  traZODone (DESYREL) 50 MG tablet, Take 1 tablet (50 mg total) by mouth at bedtime. Take 1-2 tablets at night as needed for sleep .  triamcinolone ointment (KENALOG) 0.5 %, Apply 1 Application topically 2 (two) times daily. Marland Kitchen  venlafaxine XR (EFFEXOR XR) 75 MG 24 hr capsule, Take 1 capsule (75 mg total) by mouth daily.  Review of Systems:  Review of system negative unless stated in the problem list or HPI.    Physical Exam:  Vitals:   05/05/22 1010  BP: 117/71  Pulse: 90  Temp: 98 F (36.7 C)  TempSrc: Oral  SpO2: 100%  Weight: 171 lb 3.2 oz (77.7 kg)  Height: 5\' 4"  (1.626 m)    Physical Exam General: NAD HENT: NCAT Lungs: CTAB, no wheeze, rhonchi or rales.  Cardiovascular: Normal heart sounds, no r/m/g, 2+ pulses in all extremities. No LE edema Abdomen: No TTP, normal bowel sounds MSK: No asymmetry or muscle atrophy.  Skin: no lesions noted on exposed skin Neuro: Alert and oriented x4. CN grossly intact Psych: Normal mood and normal affect   Assessment & Plan:   No problem-specific Assessment & Plan notes found for this encounter.   See Encounters Tab for problem based charting.  Patient discussed with Dr. Odella Aquas, MD Tillie Rung. [facility] Internal Medicine Residency, PGY-2   Flashes/MDD PHQ 9 was 8.on effexor but not any more

## 2022-05-08 DIAGNOSIS — Z0000 Encounter for general adult medical examination without abnormal findings: Secondary | ICD-10-CM | POA: Insufficient documentation

## 2022-05-08 NOTE — Assessment & Plan Note (Signed)
Patient has GAD and MDD. Her PHQ 9 score was 8. She takes trazodone as needed for sleep and was on Effexor 37.5 mg which was helping the patient with her symptoms and helping her hot flash symptoms. She has not taken this medications in a few months. Advised pt we will restart this medication and uptitrate it as it was helping her previously.  -Restart Effexor 37.5 mg qd.

## 2022-05-08 NOTE — Assessment & Plan Note (Addendum)
Patient seems to have illness anxiety. She request testing to rule out cancers. Advised pt she is overdue for her mammogram and order placed this visit. Advised she will need colon cancer screening when she turns 84. Counseled pt that no accurate blood test to check for cancer. A1c done and normal. Lipid panel with cholesterol <200 and LDL <100.

## 2022-05-08 NOTE — Assessment & Plan Note (Signed)
Pt has eczema that is well controlled with Kenalog ointment.  -Will refill pt's ointment.

## 2022-05-12 NOTE — Progress Notes (Signed)
Internal Medicine Clinic Attending  Case discussed with Dr. Khan  at the time of the visit.  We reviewed the resident's history and exam and pertinent patient test results.  I agree with the assessment, diagnosis, and plan of care documented in the resident's note.  

## 2022-06-07 ENCOUNTER — Ambulatory Visit
Admission: RE | Admit: 2022-06-07 | Discharge: 2022-06-07 | Disposition: A | Discharge status: Home / Self Care | Payer: Commercial Managed Care - HMO | Source: Ambulatory Visit | Attending: Internal Medicine | Admitting: Internal Medicine

## 2022-06-07 DIAGNOSIS — Z139 Encounter for screening, unspecified: Secondary | ICD-10-CM

## 2022-06-08 ENCOUNTER — Other Ambulatory Visit: Payer: Self-pay | Admitting: Internal Medicine

## 2022-06-08 DIAGNOSIS — R928 Other abnormal and inconclusive findings on diagnostic imaging of breast: Secondary | ICD-10-CM

## 2022-06-15 ENCOUNTER — Other Ambulatory Visit: Payer: Self-pay | Admitting: Internal Medicine

## 2022-06-15 ENCOUNTER — Ambulatory Visit
Admission: RE | Admit: 2022-06-15 | Discharge: 2022-06-15 | Disposition: A | Discharge status: Home / Self Care | Payer: Commercial Managed Care - HMO | Source: Ambulatory Visit | Attending: Internal Medicine | Admitting: Internal Medicine

## 2022-06-15 DIAGNOSIS — R928 Other abnormal and inconclusive findings on diagnostic imaging of breast: Secondary | ICD-10-CM

## 2022-06-15 DIAGNOSIS — R921 Mammographic calcification found on diagnostic imaging of breast: Secondary | ICD-10-CM

## 2022-06-28 ENCOUNTER — Encounter: Payer: Commercial Managed Care - HMO | Admitting: Internal Medicine

## 2022-08-11 ENCOUNTER — Other Ambulatory Visit: Payer: Self-pay | Admitting: Internal Medicine

## 2023-02-02 ENCOUNTER — Other Ambulatory Visit: Payer: Self-pay | Admitting: Internal Medicine

## 2023-02-02 DIAGNOSIS — L309 Dermatitis, unspecified: Secondary | ICD-10-CM

## 2023-04-05 ENCOUNTER — Encounter: Payer: Medicaid Other | Admitting: Student

## 2023-04-06 ENCOUNTER — Other Ambulatory Visit: Payer: Self-pay

## 2023-04-06 ENCOUNTER — Encounter: Payer: Self-pay | Admitting: Student

## 2023-04-06 ENCOUNTER — Ambulatory Visit: Payer: Medicaid Other | Admitting: Student

## 2023-04-06 VITALS — BP 110/67 | HR 83 | Temp 98.1°F | Ht 64.0 in | Wt 183.5 lb

## 2023-04-06 DIAGNOSIS — N3941 Urge incontinence: Secondary | ICD-10-CM

## 2023-04-06 DIAGNOSIS — R635 Abnormal weight gain: Secondary | ICD-10-CM | POA: Diagnosis not present

## 2023-04-06 DIAGNOSIS — N951 Menopausal and female climacteric states: Secondary | ICD-10-CM | POA: Diagnosis not present

## 2023-04-06 DIAGNOSIS — L309 Dermatitis, unspecified: Secondary | ICD-10-CM

## 2023-04-06 DIAGNOSIS — E669 Obesity, unspecified: Secondary | ICD-10-CM

## 2023-04-06 DIAGNOSIS — R3589 Other polyuria: Secondary | ICD-10-CM

## 2023-04-06 DIAGNOSIS — F32A Depression, unspecified: Secondary | ICD-10-CM

## 2023-04-06 MED ORDER — TRIAMCINOLONE ACETONIDE 0.5 % EX OINT: TOPICAL_OINTMENT | Freq: Three times a day (TID) | CUTANEOUS | 2 refills | Status: DC | PRN

## 2023-04-06 MED ORDER — PAROXETINE HCL 10 MG PO TABS
10.0000 mg | ORAL_TABLET | Freq: Every day | ORAL | 2 refills | Status: DC
Start: 2023-04-06 — End: 2023-05-05

## 2023-04-06 MED ORDER — TRAZODONE HCL 50 MG PO TABS: 50.0000 mg | ORAL_TABLET | Freq: Every day | ORAL | 2 refills | Status: DC

## 2023-04-06 NOTE — Progress Notes (Signed)
.   Subjective:  CC: eczema and hot flashes  HPI:  [name] is a 47 y.o. female with a past medical history stated below and presents today for eczema, hot flashes, and urinary problems.   Patient has been seen in this clinic for similar concerns prior to this visit. Since she was last seen in 2023, patient reports she has not had menstrual periods and continues to have debilitating hot flashes. 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 Reports that has not tried previously prescribed therapy as she was concerned about the medication itself. She has a strong family history of breast cancer and frequently smokes.   She also reports worsening of her recurrent need to pee. Sometimes has not wet herself but feels like sometimes she needs to hurry to be able to make it. Happens during the day and at night.  She wants to make sure that this is not diabetes as her research concerns that her for this.  When asked about physical therapy in the past or Kegel/pelvic floor exercises, she tells me that she is going to the gym.  No dysuria, suprapubic tenderness, lower back pain, GI changes.  Patient reports she has been gaining weight over the past year.  Has tried to return to the gym and be mindful about her diet.  Another concern exam for her today is that eczema around her bilateral ankles for which she has been sent to dermatology in the past and expresses frustration with the current state of her skin.  she continues to use Kenalog ointment for active flares, that she has been out of this for a very long time and has some flares.  She would like another referral to dermatology for injectables or escalating therapy.  Please see problem based assessment and plan for additional details.  Past Medical History:  Diagnosis Date   Acne vulgaris    Depression    Dyshidrotic eczema    Gardnerella vaginitis    Gonococcal cervicitis    Migraine 01/12/2017   STD  (female)     No current outpatient medications on file prior to visit.   No current facility-administered medications on file prior to visit.    Family History  Problem Relation Age of Onset   Breast cancer Mother 31   Stroke Mother        age 63 when she had the stroke   Hyperlipidemia Father     Social History   Socioeconomic History   Marital status: Single    Spouse name: Not on file   Number of children: Not on file   Years of education: Not on file   Highest education level: Not on file  Occupational History   Not on file  Tobacco Use   Smoking status: Never   Smokeless tobacco: Never  Substance and Sexual Activity   Alcohol use: Yes    Alcohol/week: 0.0 standard drinks of alcohol    Comment: Occasionally.   Drug use: Never   Sexual activity: Not on file  Other Topics Concern   Not on file  Social History Narrative   Has 2 children.   Father's children passed away from ruptured aneurysm in 2005.   Works at [employer].   Social Determinants of Health   Financial Resource Strain: Not on file  Food Insecurity: Not on file  Transportation Needs: Not on file  Physical Activity: Not on file  Stress: Not on file  Social Connections: Not on file  Intimate Partner Violence: Not on file    Review of Systems: ROS negative except for what is noted on the assessment and plan.  Objective:   Vitals:   04/06/23 0941  BP: 110/67  Pulse: 83  Temp: 98.1 F (36.7 C)  TempSrc: Oral  SpO2: 100%  Weight: 183 lb 8 oz (83.2 kg)  Height: 5\' 4"  (1.626 m)    Physical Exam: Constitutional: well-appearing woman sitting in chair, in no acute distress HENT: normocephalic atraumatic, mucous membranes moist Eyes: conjunctiva non-erythematous Neck: supple Cardiovascular: regular rate and rhythm, no m/r/g Pulmonary/Chest: normal work of breathing on room air, lungs clear to auscultation bilaterally Abdominal: soft, non-tender, non-distended MSK: normal bulk and  tone Neurological: alert & oriented x 3,  Skin: warm and dry Erythema, excoriation and hyperpigmentation over bilateral ankles present with dry areas of skin Psych: Pleasant mood and anxious affect      Assessment & Plan:   Eczema See physical exam. Patient with acute flare of chronic eczema and postinflammatory changes. Provided with refull of kenalog today for  acute flare and for prevention given frequent, recurrent flares at the same site. However, she may benefit of immunomodulator therapy. Will refer to dermatology for advanced maintenance therapy   Urge incontinence Patient has chronic history of this.  Her symptoms are consistent with urge incontinence.  She has not tried Kegel exercises; there is a documented history of her dad refusing information about them.  After further discussion today patient understands the pelvic anatomy and benefit from pelvic floor physical therapy in brain muscle connection and management his condition.  Discussed that supportive treatment with physical therapy and Kegel exercises are the first-line therapy with this type of incontinence.  Given patient's heightened active anxiety for developing diabetes and her current symptoms after a year of not being seen in this clinic, will obtain A1c and UA today to screen for diabetes, check for hematuria, and proteinuria.  Patient does not have suprapubic tenderness or dysuria at this time so if that is leukocytosis will likely not treat.  Will also obtain a BMP today to check for electrolyte abnormalities and renal function. -After discussion, patient would like referral to physical therapy for pelvic floor PT  Weight gain Referral to nutrition services  Menopausal vasomotor syndrome Patient reports she never took Paxil in the past; amenable to trying therapy today.  Discussed that he therapy is not effective after 4 weeks we may increase dose.  At this time I do not think the patient is a good candidate for  hormonal therapy for passive motor symptoms -Start paroxetine 10 mg daily -Return in 4 weeks for monitoring side effects    Return in about 4 weeks (around 05/04/2023) for vasomotor symptomss, urge incontinence, and weight with female provider.  Patient discussed with Dr. Alfonse Alpers, MD [facility] Internal Medicine Program - PGY-2 04/06/2023,

## 2023-04-06 NOTE — Patient Instructions (Addendum)
Thank you, [name] for allowing [place] to provide your care today. Today we discussed .:  You urination, your ankle skin problem, weight, and screening tests  Referrals: -Dermatology -Nutrition -Pelvic physical therapy  New medications: -  I have ordered the following labs for you:  Lab Orders         Urinalysis, Reflex Microscopic         BMP8+Anion Gap         POC Hbg A1C       I will call if any are abnormal. All of your labs can be accessed through "My Chart".   My Chart Access: [email or web address]  Please follow-up in: 4 weeks     We look forward to seeing you next time. Please call our clinic at [phone] if you have any questions or concerns. The best time to call is Monday-Friday from 9am-4pm, but there is someone available 24/7. If after hours or the weekend, call the main hospital number and ask for the Internal Medicine Resident On-Call. If you need medication refills, please notify your pharmacy one week in advance and they will send [place] a request.   Thank you for letting us take part in your care. Wishing you the best!  Nina Crocker, MD 04/06/2023, 10:41 AM [name] Internal Medicine Resident, PGY-1

## 2023-04-06 NOTE — Assessment & Plan Note (Signed)
See physical exam. Patient with acute flare of chronic eczema and postinflammatory changes. Provided with refull of kenalog today for  acute flare and for prevention given frequent, recurrent flares at the same site. However, she may benefit of immunomodulator therapy. Will refer to dermatology for advanced maintenance therapy

## 2023-04-06 NOTE — Assessment & Plan Note (Signed)
Referral to nutrition services

## 2023-04-06 NOTE — Assessment & Plan Note (Signed)
Patient reports she never took Paxil in the past; amenable to trying therapy today.  Discussed that he therapy is not effective after 4 weeks we may increase dose.  At this time I do not think the patient is a good candidate for hormonal therapy for passive motor symptoms -Start paroxetine 10 mg daily -Return in 4 weeks for monitoring side effects

## 2023-04-06 NOTE — Assessment & Plan Note (Signed)
Patient has chronic history of this.  Her symptoms are consistent with urge incontinence.  She has not tried Kegel exercises; there is a documented history of her dad refusing information about them.  After further discussion today patient understands the pelvic anatomy and benefit from pelvic floor physical therapy in brain muscle connection and management his condition.  Discussed that supportive treatment with physical therapy and Kegel exercises are the first-line therapy with this type of incontinence.  Given patient's heightened active anxiety for developing diabetes and her current symptoms after a year of not being seen in this clinic, will obtain A1c and UA today to screen for diabetes, check for hematuria, and proteinuria.  Patient does not have suprapubic tenderness or dysuria at this time so if that is leukocytosis will likely not treat.  Will also obtain a BMP today to check for electrolyte abnormalities and renal function. -After discussion, patient would like referral to physical therapy for pelvic floor PT

## 2023-04-07 LAB — HEMOGLOBIN A1C
Est. average glucose Bld gHb Est-mCnc: 117 mg/dL
Hgb A1c MFr Bld: 5.7 % — ABNORMAL HIGH (ref 4.8–5.6)

## 2023-04-07 LAB — URINALYSIS, ROUTINE W REFLEX MICROSCOPIC
Bilirubin, UA: NEGATIVE
Glucose, UA: NEGATIVE
Ketones, UA: NEGATIVE
Leukocytes,UA: NEGATIVE
Nitrite, UA: NEGATIVE
Protein,UA: NEGATIVE
RBC, UA: NEGATIVE
Specific Gravity, UA: 1.021 (ref 1.005–1.030)
Urobilinogen, Ur: 0.2 mg/dL (ref 0.2–1.0)
pH, UA: 7.5 (ref 5.0–7.5)

## 2023-04-07 LAB — BMP8+ANION GAP
Anion Gap: 13 mmol/L (ref 10.0–18.0)
BUN/Creatinine Ratio: 14 (ref 9–23)
BUN: 13 mg/dL (ref 6–24)
CO2: 24 mmol/L (ref 20–29)
Calcium: 9.5 mg/dL (ref 8.7–10.2)
Chloride: 103 mmol/L (ref 96–106)
Creatinine, Ser: 0.94 mg/dL (ref 0.57–1.00)
Glucose: 77 mg/dL (ref 70–99)
Potassium: 4.9 mmol/L (ref 3.5–5.2)
Sodium: 140 mmol/L (ref 134–144)
eGFR: 77 mL/min/{1.73_m2} (ref >59)

## 2023-04-11 NOTE — Progress Notes (Signed)
 Internal Medicine Clinic Attending  Case discussed with the resident at the time of the visit.  We reviewed the resident's history and exam and pertinent patient test results.  I agree with the assessment, diagnosis, and plan of care documented in the resident's note.  Debe Coder, MD

## 2023-04-18 NOTE — Progress Notes (Signed)
Patient called and aware. Discussed lifestyle modifications for prediabetes, which she has started. She will follow up with Nutrition referral and pelvic PT. Aware of 9/13 appointment to discuss re-initiation of paroxetine therapy.

## 2023-05-05 ENCOUNTER — Other Ambulatory Visit: Payer: Self-pay

## 2023-05-05 ENCOUNTER — Encounter: Payer: Self-pay | Admitting: Student

## 2023-05-05 ENCOUNTER — Ambulatory Visit: Payer: Medicaid Other | Admitting: Student

## 2023-05-05 VITALS — BP 104/64 | HR 80 | Temp 98.3°F | Ht 64.0 in | Wt 183.0 lb

## 2023-05-05 DIAGNOSIS — L309 Dermatitis, unspecified: Secondary | ICD-10-CM

## 2023-05-05 DIAGNOSIS — R635 Abnormal weight gain: Secondary | ICD-10-CM

## 2023-05-05 DIAGNOSIS — N951 Menopausal and female climacteric states: Secondary | ICD-10-CM | POA: Diagnosis present

## 2023-05-05 DIAGNOSIS — N3941 Urge incontinence: Secondary | ICD-10-CM | POA: Diagnosis not present

## 2023-05-05 MED ORDER — SEMAGLUTIDE-WEIGHT MANAGEMENT 2.4 MG/0.75ML ~~LOC~~ SOAJ
2.4000 mg | SUBCUTANEOUS | 0 refills | Status: DC
Start: 2023-08-29 — End: 2023-07-03

## 2023-05-05 MED ORDER — SEMAGLUTIDE-WEIGHT MANAGEMENT 0.25 MG/0.5ML ~~LOC~~ SOAJ
0.2500 mg | SUBCUTANEOUS | 0 refills | Status: AC
Start: 2023-05-05 — End: 2023-06-02

## 2023-05-05 MED ORDER — PIMECROLIMUS 1 % EX CREA
TOPICAL_CREAM | Freq: Two times a day (BID) | CUTANEOUS | 3 refills | Status: DC
Start: 2023-05-05 — End: 2023-07-03

## 2023-05-05 MED ORDER — PAROXETINE HCL ER 25 MG PO TB24
25.0000 mg | ORAL_TABLET | Freq: Every day | ORAL | 3 refills | Status: DC
Start: 2023-05-05 — End: 2023-07-03

## 2023-05-05 MED ORDER — SEMAGLUTIDE-WEIGHT MANAGEMENT 1 MG/0.5ML ~~LOC~~ SOAJ
1.0000 mg | SUBCUTANEOUS | 0 refills | Status: DC
Start: 2023-07-02 — End: 2023-07-03

## 2023-05-05 MED ORDER — SEMAGLUTIDE-WEIGHT MANAGEMENT 0.5 MG/0.5ML ~~LOC~~ SOAJ: 0.5000 mg | SUBCUTANEOUS | 0 refills | Status: AC

## 2023-05-05 MED ORDER — SEMAGLUTIDE-WEIGHT MANAGEMENT 1.7 MG/0.75ML ~~LOC~~ SOAJ
1.7000 mg | SUBCUTANEOUS | 0 refills | Status: DC
Start: 2023-07-31 — End: 2023-07-03

## 2023-05-05 NOTE — Assessment & Plan Note (Signed)
Never took Paxil in the past but started last visit. Currently on 10 mg daily.  Patient denies any changes in her signs or symptoms.  For this reason, we will increase her dosage to 25 mg/day. -Increase paxil to 25 mg

## 2023-05-05 NOTE — Assessment & Plan Note (Signed)
Patient has been exercising consistently and maintaining a well-balanced diet for the past 4 months but is still struggling to lose weight.  As she is currently prediabetes and has gained 30 pounds over the last 6 months, we feel she is a good candidate for [facility]. - Begin 0.25 mg/week of [facility]

## 2023-05-05 NOTE — Assessment & Plan Note (Signed)
Patient has been seen by dermatology in the past.  She continues to endorse itchy skin that peels.  She currently has erythema in her left and right foot near her Achilles consistent with chronic eczema.  She has remained symptomatic even with the triamcinolone cream.  We are concerned that she may have developed resistance so we have prescribed her Elidel cream.  - Elidel 1% cream

## 2023-05-05 NOTE — Assessment & Plan Note (Signed)
Patient has chronic history.  She was educated on Kegel exercises on her last visit but has not started these.  She notes that she continues to go to the restroom 4-5 times at night and endorses signs and symptoms corresponding to urgent continence.  We reeducated her on the importance of starting this therapy and we will reevaluate her symptoms at the next visit. -Start Kegel exercises

## 2023-05-05 NOTE — Patient Instructions (Addendum)
Thank you so much for coming to the clinic today!   For your eczema, we have prescribed you a new medication called Elidel.  Please stop taking the triamcinolone cream.  For your weight management, I have put in the orders for [facility].  I will work on the prior authorization for this medication and you should hear back in the next few weeks.  For your urination complications, please begin Kegel exercises.  We have also increased your Paxil dosage to 25 mg to help with the menopausal symptoms.  Will see you again in 1 month.  If you have any questions please feel free to the call the clinic at anytime at [phone]. It was a pleasure seeing you!  Best, Dr. Rayvon Char

## 2023-05-05 NOTE — Progress Notes (Signed)
CC: Follow-up  HPI: [name] is a 47 y.o. female living with a history stated below and presents today for follow-up. Please see problem based assessment and plan for additional details.  Past Medical History:  Diagnosis Date   Acne vulgaris    Depression    Dyshidrotic eczema    Gardnerella vaginitis    Gonococcal cervicitis    Migraine 01/12/2017   STD (female)     Current Outpatient Medications on File Prior to Visit  Medication Sig Dispense Refill   traZODone (DESYREL) 50 MG tablet Take 1 tablet (50 mg total) by mouth at bedtime. Take 1-2 tablets at night as needed for sleep 90 tablet 2   No current facility-administered medications on file prior to visit.    Family History  Problem Relation Age of Onset   Breast cancer Mother 54   Stroke Mother        age 83 when she had the stroke   Hyperlipidemia Father     Social History   Socioeconomic History   Marital status: Single    Spouse name: Not on file   Number of children: Not on file   Years of education: Not on file   Highest education level: Not on file  Occupational History   Not on file  Tobacco Use   Smoking status: Never   Smokeless tobacco: Never  Substance and Sexual Activity   Alcohol use: Yes    Alcohol/week: 0.0 standard drinks of alcohol    Comment: Occasionally.   Drug use: Never   Sexual activity: Not on file  Other Topics Concern   Not on file  Social History Narrative   Has 2 children.   Father's children passed away from ruptured aneurysm in 2005.   Works at [employer].   Social Determinants of Health   Financial Resource Strain: Low Risk  (05/05/2023)   Overall Financial Resource Strain (CARDIA)    Difficulty of Paying Living Expenses: Not hard at all  Food Insecurity: No Food Insecurity (05/05/2023)   Hunger Vital Sign    Worried About Running Out of Food in the Last Year: Never true    Ran Out of Food in the Last Year: Never true  Transportation Needs: No  Transportation Needs (05/05/2023)   PRAPARE - Administrator, Civil Service (Medical): No    Lack of Transportation (Non-Medical): No  Physical Activity: Sufficiently Active (05/05/2023)   Exercise Vital Sign    Days of Exercise per Week: 3 days    Minutes of Exercise per Session: 90 min  Stress: No Stress Concern Present (05/05/2023)   [employer] of Occupational Health - Occupational Stress Questionnaire    Feeling of Stress : Not at all  Social Connections: Moderately Integrated (05/05/2023)   Social Connection and Isolation Panel [NHANES]    Frequency of Communication with Friends and Family: More than three times a week    Frequency of Social Gatherings with Friends and Family: More than three times a week    Attends Religious Services: More than 4 times per year    Active Member of [employer] or Organizations: Yes    Attends Banker Meetings: Never    Marital Status: Never married  Intimate Partner Violence: Not At Risk (05/05/2023)   Humiliation, Afraid, Rape, and Kick questionnaire    Fear of Current or Ex-Partner: No    Emotionally Abused: No    Physically Abused: No    Sexually Abused: No  Review of Systems: ROS negative except for what is noted on the assessment and plan.  Vitals:   05/05/23 0913  BP: 104/64  Pulse: 80  Temp: 98.3 F (36.8 C)  TempSrc: Oral  SpO2: 100%  Weight: 183 lb (83 kg)  Height: 5\' 4"  (1.626 m)    Physical Exam: Constitutional: well-appearing in no acute distress HENT: normocephalic atraumatic, mucous membranes moist Eyes: conjunctiva non-erythematous Neck: supple Cardiovascular: regular rate and rhythm, no m/r/g Pulmonary/Chest: normal work of breathing on room air, lungs clear to auscultation bilaterally Abdominal: soft, non-tender, non-distended MSK: normal bulk and tone, erythema in her bilateral feet around her Achilles Neurological: alert & oriented x 3, 5/5 strength in bilateral upper and lower  extremities, normal gait Skin: warm and dry   Assessment & Plan:   Menopausal vasomotor syndrome Never took Paxil in the past but started last visit. Currently on 10 mg daily.  Patient denies any changes in her signs or symptoms.  For this reason, we will increase her dosage to 25 mg/day. -Increase paxil to 25 mg  Urge incontinence Patient has chronic history.  She was educated on Kegel exercises on her last visit but has not started these.  She notes that she continues to go to the restroom 4-5 times at night and endorses signs and symptoms corresponding to urgent continence.  We reeducated her on the importance of starting this therapy and we will reevaluate her symptoms at the next visit. -Start Kegel exercises  Weight gain Patient has been exercising consistently and maintaining a well-balanced diet for the past 4 months but is still struggling to lose weight.  As she is currently prediabetes and has gained 30 pounds over the last 6 months, we feel she is a good candidate for [facility]. - Begin 0.25 mg/week of Wegovy  Eczema Patient has been seen by dermatology in the past.  She continues to endorse itchy skin that peels.  She currently has erythema in her left and right foot near her Achilles consistent with chronic eczema.  She has remained symptomatic even with the triamcinolone cream.  We are concerned that she may have developed resistance so we have prescribed her Elidel cream.  - Elidel 1% cream   Patient seen with Dr. Collier Flowers, MD  [facility] Internal Medicine, PGY-1 Date 05/05/2023 Time 12:10 PM

## 2023-05-08 ENCOUNTER — Telehealth: Payer: Self-pay

## 2023-05-08 NOTE — Telephone Encounter (Signed)
Decision:Denied Tiffay Kearnes (Key: [id number]) Reginal Lutes 2.4MG /0.75ML auto-injectors Form [employer] Electronic Prior Authorization Form [phone] NCPDP) Message from Plan Request Reference Number: [id number]. WEGOVY INJ 2.4MG  is denied for not meeting the prior authorization requirement(s). For further questions, call Community & State at [phone] for more information.

## 2023-05-08 NOTE — Telephone Encounter (Signed)
Prior Authorization for patient [name] 2.4MG /0.75ML) came through on cover my meds was submitted with last office notes and labs awaiting approval or denial.   MVH:[id number]

## 2023-05-12 NOTE — Progress Notes (Signed)
Internal Medicine Clinic Attending  I was physically present during the key portions of the resident provided service and participated in the medical decision making of patient's management care. I reviewed pertinent patient test results.  The assessment, diagnosis, and plan were formulated together and I agree with the documentation in the resident's note.  Gust Rung, DO  Body mass index is 31.41 kg/m. Lab Results  Component Value Date   HGBA1C 5.7 (H) 04/06/2023

## 2023-05-28 ENCOUNTER — Encounter (HOSPITAL_COMMUNITY): Payer: Self-pay | Admitting: *Deleted

## 2023-05-28 ENCOUNTER — Other Ambulatory Visit: Payer: Self-pay

## 2023-05-28 ENCOUNTER — Emergency Department (HOSPITAL_COMMUNITY): Payer: Medicaid Other

## 2023-05-28 ENCOUNTER — Emergency Department (HOSPITAL_COMMUNITY)
Admission: EM | Admit: 2023-05-28 | Discharge: 2023-05-29 | Disposition: A | Discharge status: Home / Self Care | Payer: Medicaid Other | Attending: Emergency Medicine | Admitting: Emergency Medicine

## 2023-05-28 DIAGNOSIS — W010XXA Fall on same level from slipping, tripping and stumbling without subsequent striking against object, initial encounter: Secondary | ICD-10-CM | POA: Diagnosis not present

## 2023-05-28 DIAGNOSIS — M79601 Pain in right arm: Secondary | ICD-10-CM | POA: Insufficient documentation

## 2023-05-28 NOTE — ED Triage Notes (Signed)
Pt reporting about 3 days ago she fell onto her right arm while standing on a chair. She has pain in the upper arm, denies elbow pain. "It feels like something is cracked". Taking tylenol without relief.

## 2023-05-28 NOTE — ED Notes (Signed)
Patient transported to X-ray 

## 2023-05-29 ENCOUNTER — Other Ambulatory Visit (HOSPITAL_COMMUNITY): Payer: Self-pay

## 2023-05-29 ENCOUNTER — Telehealth: Payer: Self-pay | Admitting: Internal Medicine

## 2023-05-29 MED ORDER — SEMAGLUTIDE-WEIGHT MANAGEMENT 0.25 MG/0.5ML ~~LOC~~ SOAJ
0.2500 mg | SUBCUTANEOUS | 0 refills | Status: DC
Start: 2023-05-04 — End: 2023-07-03
  Filled 2023-05-29 – 2023-05-31 (×2): qty 2, 28d supply, fill #0

## 2023-05-29 MED ORDER — KETOROLAC TROMETHAMINE 15 MG/ML IJ SOLN
15.0000 mg | Freq: Once | INTRAMUSCULAR | Status: AC
  Administered 2023-05-29: 15 mg via INTRAMUSCULAR
  Filled 2023-05-29: qty 1

## 2023-05-29 NOTE — Telephone Encounter (Signed)
I called pt - she stated she just called [facility] and they do have it. And they will call [facility] to transfer the [facility] rxs.

## 2023-05-29 NOTE — Discharge Instructions (Addendum)
You were evaluated today for right upper arm/shoulder pain.  Your x-rays were reassuring.  The sling you have been placed and is for comfort and to rest the extremity.  I recommend taking acetaminophen and ibuprofen at home for pain management.  If your pain does not improve over the next 1 to 2 weeks I recommend following up with orthopedics for further evaluation.

## 2023-05-29 NOTE — Telephone Encounter (Signed)
Pt states she has called 3 pharmacies and no one has the medication in stock. Pt states can the prescription be changed to something different that is in stock.  Pt states she has been waiting for a Refill since her last OV on 05/05/2023 ,and that no one has called her back in reference to her Refill.  [facility]   Semaglutide-Weight Management 0.25 MG/0.5ML SOAJ Semaglutide-Weight Management 0.5 MG/0.5ML SOAJ Semaglutide-Weight Management 1 MG/0.5ML SOAJ Semaglutide-Weight Management 1.7 MG/0.75ML SOAJ Semaglutide-Weight Management 2.4 MG/0.75ML SOAJ  [id number] - [place], [place] - [address] AT [address]

## 2023-05-29 NOTE — Telephone Encounter (Signed)
Pa for pt [facility] )  came through on cover my meds  was  submitted with last office notes  and a DX  code  for R 63.5- Abnormal weight gain .Marland Kitchen Awaiting  approval or denial

## 2023-05-29 NOTE — ED Provider Notes (Signed)
[facility] EMERGENCY DEPARTMENT AT [facility] Provider Note   CSN: [id number] Arrival date & time: 05/28/23  2218     History  Chief Complaint  Patient presents with   Arm Injury    [name] is a 47 y.o. female.  Patient presents to the emergency room feeling of right upper arm pain.  Patient states that 3 days ago she was standing in her house trying to put lights up on a wall when she slipped and fell.  She states she began to have upper right arm pain at that time which is worsened over the past few days.  She denies any her head and denies losing consciousness.  Past medical history significant for anxiety and depression, migraines   Arm Injury      Home Medications Prior to Admission medications   Medication Sig Start Date End Date Taking? Authorizing Provider  PARoxetine (PAXIL CR) 25 MG 24 hr tablet Take 1 tablet (25 mg total) by mouth daily. 05/05/23   Morrie Sheldon, MD  pimecrolimus (ELIDEL) 1 % cream Apply topically 2 (two) times daily. 05/05/23   Morrie Sheldon, MD  Semaglutide-Weight Management 0.25 MG/0.5ML SOAJ Inject 0.25 mg into the skin once a week for 28 days. 05/05/23 06/02/23  Morrie Sheldon, MD  Semaglutide-Weight Management 0.5 MG/0.5ML SOAJ Inject 0.5 mg into the skin once a week for 28 days. 06/03/23 07/01/23  Morrie Sheldon, MD  Semaglutide-Weight Management 1 MG/0.5ML SOAJ Inject 1 mg into the skin once a week for 28 days. 07/02/23 07/30/23  Morrie Sheldon, MD  Semaglutide-Weight Management 1.7 MG/0.75ML SOAJ Inject 1.7 mg into the skin once a week for 28 days. 07/31/23 08/28/23  Morrie Sheldon, MD  Semaglutide-Weight Management 2.4 MG/0.75ML SOAJ Inject 2.4 mg into the skin once a week for 28 days. 08/29/23 09/26/23  Morrie Sheldon, MD  traZODone (DESYREL) 50 MG tablet Take 1 tablet (50 mg total) by mouth at bedtime. Take 1-2 tablets at night as needed for sleep 04/06/23   Morene Crocker, MD      Allergies    Patient has no known  allergies.    Review of Systems   Review of Systems  Physical Exam Updated Vital Signs BP (!) 140/65 (BP Location: Right Arm)   Pulse 86   Temp 98.9 F (37.2 C) (Oral)   Resp 16   LMP 07/01/2020   SpO2 98%  Physical Exam Vitals and nursing note reviewed.  HENT:     Head: Normocephalic and atraumatic.  Eyes:     Pupils: Pupils are equal, round, and reactive to light.  Pulmonary:     Effort: Pulmonary effort is normal. No respiratory distress.  Musculoskeletal:        General: Tenderness and signs of injury present. No swelling or deformity. Normal range of motion.     Cervical back: Normal range of motion.     Comments: Grossly normal range of motion at right shoulder.  Patient able to abduct the right shoulder but complains of pain with resisted abduction.  Patient also complains of pain with resisted internal rotation.  No obvious deformity.  Generalized tenderness to palpation along the lateral right upper arm.  Skin:    General: Skin is dry.  Neurological:     Mental Status: She is alert.  Psychiatric:        Speech: Speech normal.        Behavior: Behavior normal.     ED Results / Procedures / Treatments   Labs (  all labs ordered are listed, but only abnormal results are displayed) Labs Reviewed - No data to display  EKG None  Radiology DG Humerus Right  Result Date: 05/28/2023 CLINICAL DATA:  Fall and arm pain.  Felt like something cracked. EXAM: RIGHT HUMERUS - 2+ VIEW COMPARISON:  None Available. FINDINGS: There is no evidence of fracture or other focal bone lesions. Soft tissues are unremarkable. IMPRESSION: Negative. Electronically Signed   By: Minerva Fester M.D.   On: 05/28/2023 23:28    Procedures .Ortho Injury Treatment  Date/Time: 05/29/2023 12:56 AM  Performed by: Darrick Grinder, PA-C Authorized by: Darrick Grinder, PA-C   Consent:    Consent obtained:  Verbal   Consent given by:  Patient   Risks discussed:  Nerve damage, restricted joint  movement, stiffness and vascular damageInjury location: upper arm Location details: right upper arm Injury type: soft tissue Pre-procedure neurovascular assessment: neurovascularly intact Immobilization: sling Splint Applied by: ED Nurse Post-procedure neurovascular assessment: post-procedure neurovascularly intact       Medications Ordered in ED Medications  ketorolac (TORADOL) 15 MG/ML injection 15 mg (has no administration in time range)    ED Course/ Medical Decision Making/ A&P                                 Medical Decision Making Amount and/or Complexity of Data Reviewed Radiology: ordered.  Risk Prescription drug management.   This patient presents to the ED for concern of right arm pain, this involves an extensive number of treatment options, and is a complaint that carries with it a high risk of complications and morbidity.  The differential diagnosis includes fracture, dislocation, soft tissue injury, others   Co morbidities that complicate the patient evaluation  Anxiety   Imaging Studies ordered:  I ordered imaging studies including plain films of the right humerus I independently visualized and interpreted imaging which showed no fracture or dislocation I agree with the radiologist interpretation   Problem List / ED Course / Critical interventions / Medication management   I ordered medication including Toradol for inflammation Reevaluation of the patient after these medicines showed that the patient improved I have reviewed the patients home medicines and have made adjustments as needed   Social Determinants of Health:  Patient has Medicaid for her primary health insurance type and has no primary care provider   Test / Admission - Considered:  Patient with no fracture or dislocation on imaging.  Patient able to actively move the joint but has pain with resisted movements.  Symptoms concerning for possible rotator cuff etiology.  Patient was  placed in a sling for comfort.  Patient advised to take ibuprofen and acetaminophen at home for pain control.  Follow-up information for orthopedics provided.  Discharged home.         Final Clinical Impression(s) / ED Diagnoses Final diagnoses:  Right arm pain    Rx / DC Orders ED Discharge Orders     None         Pamala Duffel 05/29/23 0057    Nira Conn, MD 05/30/23 [phone]

## 2023-05-30 NOTE — Telephone Encounter (Signed)
DECISION :     Due to being denied before  PA unable to be approved .Marland KitchenMarland KitchenMarland Kitchen

## 2023-05-31 ENCOUNTER — Encounter (HOSPITAL_COMMUNITY): Payer: Self-pay

## 2023-05-31 ENCOUNTER — Ambulatory Visit: Payer: Self-pay | Admitting: Physical Therapy

## 2023-05-31 ENCOUNTER — Other Ambulatory Visit (HOSPITAL_COMMUNITY): Payer: Self-pay

## 2023-06-12 ENCOUNTER — Encounter: Payer: Medicaid Other | Admitting: Internal Medicine

## 2023-06-12 NOTE — Progress Notes (Deleted)
This is a Psychologist, occupational Note.  The care of the patient was discussed with Dr. Marland Kitchen and the assessment and plan was formulated with their assistance.  Please see their note for official documentation of the patient encounter.   Subjective:   Patient ID: [name] female   DOB: 1976-06-07 47 y.o.   MRN: [id number]  HPI: [name] is a 47 y.o. presenting today for ***     Past Medical History:  Diagnosis Date   Acne vulgaris    Depression    Dyshidrotic eczema    Gardnerella vaginitis    Gonococcal cervicitis    Migraine 01/12/2017   STD (female)    Current Outpatient Medications  Medication Sig Dispense Refill   PARoxetine (PAXIL CR) 25 MG 24 hr tablet Take 1 tablet (25 mg total) by mouth daily. 30 tablet 3   pimecrolimus (ELIDEL) 1 % cream Apply topically 2 (two) times daily. 30 g 3   Semaglutide-Weight Management 0.25 MG/0.5ML SOAJ Inject 0.25 mg into the skin once a week. 2 mL 0   Semaglutide-Weight Management 0.5 MG/0.5ML SOAJ Inject 0.5 mg into the skin once a week for 28 days. 2 mL 0   [START ON 07/02/2023] Semaglutide-Weight Management 1 MG/0.5ML SOAJ Inject 1 mg into the skin once a week for 28 days. 2 mL 0   [START ON 07/31/2023] Semaglutide-Weight Management 1.7 MG/0.75ML SOAJ Inject 1.7 mg into the skin once a week for 28 days. 3 mL 0   [START ON 08/29/2023] Semaglutide-Weight Management 2.4 MG/0.75ML SOAJ Inject 2.4 mg into the skin once a week for 28 days. 3 mL 0   traZODone (DESYREL) 50 MG tablet Take 1 tablet (50 mg total) by mouth at bedtime. Take 1-2 tablets at night as needed for sleep 90 tablet 2   No current facility-administered medications for this visit.   Family History  Problem Relation Age of Onset   Breast cancer Mother 65   Stroke Mother        age 52 when she had the stroke   Hyperlipidemia Father    Social History   Socioeconomic History   Marital status: Single    Spouse name: Not on file   Number of children: Not on file    Years of education: Not on file   Highest education level: Not on file  Occupational History   Not on file  Tobacco Use   Smoking status: Never   Smokeless tobacco: Never  Substance and Sexual Activity   Alcohol use: Yes    Alcohol/week: 0.0 standard drinks of alcohol    Comment: Occasionally.   Drug use: Never   Sexual activity: Not on file  Other Topics Concern   Not on file  Social History Narrative   Has 2 children.   Father's children passed away from ruptured aneurysm in 2005.   Works at [employer].   Social Determinants of Health   Financial Resource Strain: Low Risk  (05/05/2023)   Overall Financial Resource Strain (CARDIA)    Difficulty of Paying Living Expenses: Not hard at all  Food Insecurity: No Food Insecurity (05/05/2023)   Hunger Vital Sign    Worried About Running Out of Food in the Last Year: Never true    Ran Out of Food in the Last Year: Never true  Transportation Needs: No Transportation Needs (05/05/2023)   PRAPARE - Administrator, Civil Service (Medical): No    Lack of Transportation (Non-Medical): No  Physical  Activity: Sufficiently Active (05/05/2023)   Exercise Vital Sign    Days of Exercise per Week: 3 days    Minutes of Exercise per Session: 90 min  Stress: No Stress Concern Present (05/05/2023)   [employer] of Occupational Health - Occupational Stress Questionnaire    Feeling of Stress : Not at all  Social Connections: Moderately Integrated (05/05/2023)   Social Connection and Isolation Panel [NHANES]    Frequency of Communication with Friends and Family: More than three times a week    Frequency of Social Gatherings with Friends and Family: More than three times a week    Attends Religious Services: More than 4 times per year    Active Member of [employer] or Organizations: Yes    Attends Banker Meetings: Never    Marital Status: Never married   Review of Systems: Pertinent items noted in HPI and remainder of  comprehensive ROS otherwise negative.  Objective:  Physical Exam: There were no vitals filed for this visit. LMP 07/01/2020   General Appearance:    Alert, cooperative, no distress, appears stated age  Head:    Normocephalic, without obvious abnormality, atraumatic  Eyes:    PERRL, conjunctiva/corneas clear, EOM's intact, fundi    benign, both eyes  Ears:    Normal TM's and external ear canals, both ears  Nose:   Nares normal, septum midline, mucosa normal, no drainage    or sinus tenderness  Throat:   Lips, mucosa, and tongue normal; teeth and gums normal  Neck:   Supple, symmetrical, trachea midline, no adenopathy;    thyroid:  no enlargement/tenderness/nodules; no carotid   bruit or JVD  Back:     Symmetric, no curvature, ROM normal, no CVA tenderness  Lungs:     Clear to auscultation bilaterally, respirations unlabored  Chest Wall:    No tenderness or deformity   Heart:    Regular rate and rhythm, S1 and S2 normal, no murmur, rub   or gallop  Abdomen:     Soft, non-tender, bowel sounds active all four quadrants,    no masses, no organomegaly  Extremities:   Extremities normal, atraumatic, no cyanosis or edema  Pulses:   2+ and symmetric all extremities  Skin:   Skin color, texture, turgor normal, no rashes or lesions  Lymph nodes:   Cervical, supraclavicular, and axillary nodes normal  Neurologic:   CNII-XII intact, normal strength, sensation and reflexes    throughout   Assessment & Plan:   Right upper extremity pain Slipped and fell on 10/3 putting up lights in the house and hit her arm. No reported head trauma or LOC. She presented to the ED on 10/6 after the pain worsened over the next 3 days, who suspected rotator cuff etiology. She was provided Toradol for the inflammation and a sling, and she was counseled to manage pain with NSAIDs and follow up with orthopedics if needed.  -   Menopausal vasomotor syndrome ***Never took Paxil in the past but started last visit.  Currently on 10 mg daily.  Patient denies any changes in her signs or symptoms.  For this reason, we will increase her dosage to 25 mg/day. -    Urge incontinence ***Patient has chronic history.  She was educated on Kegel exercises on her last visit but has not started these.  She notes that she continues to go to the restroom 4-5 times at night and endorses signs and symptoms corresponding to urgent continence.  We reeducated her on  the importance of starting this therapy and we will reevaluate her symptoms at the next visit. -    Weight gain Wegovy prior auth denied by insurance as of 10/9 ***Patient has been exercising consistently and maintaining a well-balanced diet for the past 4 months but is still struggling to lose weight.  As she is currently prediabetes and has gained 30 pounds over the last 6 months, we feel she is a good candidate for [facility]. Started 0.25 on 9/12, 0.5 on 10/12, 1 on 11/10, 1.7 on 12/9, 2.4 on 1/7 -    Eczema ***Patient has been seen by dermatology in the past.  She continues to endorse itchy skin that peels.  She currently has erythema in her left and right foot near her Achilles consistent with chronic eczema.  She has remained symptomatic even with the triamcinolone cream.  We are concerned that she may have developed resistance so we have prescribed her Elidel cream.  - Continue Elidel 1% cream

## 2023-06-19 ENCOUNTER — Other Ambulatory Visit: Payer: Self-pay | Admitting: Internal Medicine

## 2023-06-19 DIAGNOSIS — R928 Other abnormal and inconclusive findings on diagnostic imaging of breast: Secondary | ICD-10-CM

## 2023-06-20 ENCOUNTER — Telehealth: Payer: Self-pay | Admitting: *Deleted

## 2023-06-20 NOTE — Telephone Encounter (Signed)
Called and spoke with patient appointment information for mammogram given / 07-13-2023 arrive 2:30 pm. $75 no show fee will be charged if not r/s or called and cancelled.

## 2023-06-29 ENCOUNTER — Encounter: Payer: Self-pay | Admitting: Physical Therapy

## 2023-06-29 ENCOUNTER — Ambulatory Visit: Payer: Medicaid Other | Attending: Internal Medicine | Admitting: Physical Therapy

## 2023-06-29 ENCOUNTER — Other Ambulatory Visit: Payer: Self-pay

## 2023-06-29 DIAGNOSIS — M6281 Muscle weakness (generalized): Secondary | ICD-10-CM | POA: Diagnosis not present

## 2023-06-29 DIAGNOSIS — R293 Abnormal posture: Secondary | ICD-10-CM | POA: Diagnosis not present

## 2023-06-29 DIAGNOSIS — N3941 Urge incontinence: Secondary | ICD-10-CM | POA: Insufficient documentation

## 2023-06-29 DIAGNOSIS — R279 Unspecified lack of coordination: Secondary | ICD-10-CM | POA: Insufficient documentation

## 2023-06-29 NOTE — Patient Instructions (Signed)
Urge Incontinence  Ideal urination frequency is every 2-4 wakeful hours, which equates to 5-8 times within a 24-hour period.   Urge incontinence is leakage that occurs when the bladder muscle contracts, creating a sudden need to go before getting to the bathroom.   Going too often when your bladder isn't actually full can disrupt the body's automatic signals to store and hold urine longer, which will increase urgency/frequency.  In this case, the bladder "is running the show" and strategies can be learned to retrain this pattern.   One should be able to control the first urge to urinate, at around 150mL.  The bladder can hold up to a "grande latte," or 400mL. To help you gain control, practice the Urge Drill below when urgency strikes.  This drill will help retrain your bladder signals and allow you to store and hold urine longer.  The overall goal is to stretch out your time between voids to reach a more manageable voiding schedule.    Practice your "quick flicks" often throughout the day (each waking hour) even when you don't need feel the urge to go.  This will help strengthen your pelvic floor muscles, making them more effective in controlling leakage.  Urge Drill  When you feel an urge to go, follow these steps to regain control: Stop what you are doing and be still Take one deep breath, directing your air into your abdomen Think an affirming thought, such as "I've got this." Do 5 quick flicks of your pelvic floor Walk with control to the bathroom to void, or delay voiding   Bladder Irritants  Certain foods and beverages can be irritating to the bladder.  Avoiding these irritants may decrease your symptoms of urinary urgency, frequency or bladder pain.  Even reducing your intake can help with your symptoms.  Not everyone is sensitive to all bladder irritants, so you may consider focusing on one irritant at a time, removing or reducing your intake of that irritant for 7-10 days to see if  this change helps your symptoms.  Water intake is also very important.  Below is a list of bladder irritants.  Drinks: alcohol, carbonated beverages, caffeinated beverages such as coffee and tea, drinks with artificial sweeteners, citrus juices, apple juice, tomato juice  Foods: tomatoes and tomato based foods, spicy food, sugar and artificial sweeteners, vinegar, chocolate, raw onion, apples, citrus fruits, pineapple, cranberries, tomatoes, strawberries, plums, peaches, cantaloupe  Other: acidic urine (too concentrated) - see water intake info below  Substitutes you can try that are NOT irritating to the bladder: cooked onion, pears, papayas, sun-brewed decaf teas, watermelons, non-citrus herbal teas, apricots, kava and low-acid instant drinks (Postum).    WATER INTAKE: Remember to drink lots of water (aim for fluid intake of half your body weight with 2/3 of fluids being water).  You may be limiting fluids due to fear of leakage, but this can actually worsen urgency symptoms due to highly concentrated urine.  Water helps balance the pH of your urine so it doesn't become too acidic - acidic urine is a bladder irritant!    

## 2023-06-29 NOTE — Therapy (Signed)
OUTPATIENT PHYSICAL THERAPY FEMALE PELVIC EVALUATION   Patient Name: [name] MRN: [id number] DOB:Aug 15, 1976, 47 y.o., female Today's Date: 06/29/2023  END OF SESSION:  PT End of Session - 06/29/23 1111     Visit Number 1    Date for PT Re-Evaluation 10/27/23    Authorization Type MEDICAID [employer]    PT Start Time 1105   arrival time   PT Stop Time 1139    PT Time Calculation (min) 34 min    Activity Tolerance Patient tolerated treatment well    Behavior During Therapy [facility] for tasks assessed/performed             Past Medical History:  Diagnosis Date   Acne vulgaris    Depression    Dyshidrotic eczema    Gardnerella vaginitis    Gonococcal cervicitis    Migraine 01/12/2017   STD (female)    Past Surgical History:  Procedure Laterality Date   TUBAL LIGATION  06/2004   bilateral   Patient Active Problem List   Diagnosis Date Noted   Health care maintenance 05/08/2022   Urge incontinence 06/24/2020   Eczema 02/03/2020   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   Pelvic pain 05/22/2018   Weight gain 05/22/2018   High risk heterosexual behavior 03/20/2017   Anxiety and depression 01/12/2017   Papanicolaou smear of cervix with positive high risk human papilloma virus (HPV) test 12/23/2015    PCP: Rudene Christians, DO  REFERRING PROVIDER: Mercie Eon, MD  REFERRING DIAG: N39.41 (ICD-10-CM) - Urge incontinence  THERAPY DIAG:  Muscle weakness (generalized)  Abnormal posture  Unspecified lack of coordination  Rationale for Evaluation and Treatment: Rehabilitation  ONSET DATE: 4 years  SUBJECTIVE:                                                                                                                                                                                           SUBJECTIVE STATEMENT: Increased urinary frequency especially at night - at least 5x night sometimes more. "It has taken over my life. I feel like I  always have to pee." If can't get to the bathroom quickly enough will lose bladder.   Fluid intake: Yes: water - couples bottles here and there per pt, soda sometimes but not every day, lemonade daily     PAIN:  Are you having pain? No   PRECAUTIONS: None  RED FLAGS: None   WEIGHT BEARING RESTRICTIONS: No  FALLS:  Has patient fallen in last 6 months? No  LIVING ENVIRONMENT: Lives with: lives with their family Lives in: House/apartment   OCCUPATION: hair and makeup  PLOF: Independent  PATIENT  GOALS: to have less leakage  PERTINENT HISTORY:  Depression, tubal ligations, garderella vaginitis, cervicitis,Papanicolaou smear of cervix with positive high risk human papilloma virus (HPV) test Sexual abuse: No  BOWEL MOVEMENT: Pain with bowel movement: No Type of bowel movement:Type (Bristol Stool Scale) 4, Frequency daily, and Strain No Fully empty rectum: No Leakage: No Pads: No Fiber supplement: No  URINATION: Pain with urination: No Fully empty bladder: Yes:   Stream: Strong Urgency: Yes: every time Frequency: around 2 hours during the day, 5+x night Leakage: Urge to void and Walking to the bathroom Pads: No  INTERCOURSE: Pain with intercourse:  hasn't been active in 4 years but wasn't painful Ability to have vaginal penetration:  Yes: but hasn't been active Climax: not painful Marinoff Scale: 0/3  PREGNANCY: Vaginal deliveries 2 Tearing Yes: needed stitches C-section deliveries 0 Currently pregnant No  PROLAPSE: None   OBJECTIVE:  Note: Objective measures were completed at Evaluation unless otherwise noted.  DIAGNOSTIC FINDINGS:    PATIENT SURVEYS:    PFIQ-7 33 summary score  COGNITION: Overall cognitive status: Within functional limits for tasks assessed     SENSATION: Light touch: Appears intact Proprioception: Appears intact  MUSCLE LENGTH: Bil hamstrings and adductors limited by 25%  LUMBAR SPECIAL TESTS:  WFL  FUNCTIONAL  TESTS:  Bil knee valgus with functional squat  GAIT: WFL  POSTURE: rounded shoulders and anterior pelvic tilt  PELVIC ALIGNMENT: WFL  LUMBARAROM/PROM:  A/PROM A/PROM  eval  Flexion Limited by 25%  Extension WFL  Right lateral flexion WFL  Left lateral flexion WFL  Right rotation Limited by 25%  Left rotation Limited by 25%   (Blank rows = not tested)  LOWER EXTREMITY ROM: WFL  LOWER EXTREMITY MMT:  Bil hips grossly 4/5, knees 5/5  PALPATION:   General  no TTP but did have tension at bil lumbar paraspinals                External Perineal Exam no TTP, [facility]                             Internal Pelvic Floor no TTP  Patient confirms identification and approves PT to assess internal pelvic floor and treatment Yes No emotional/communication barriers or cognitive limitation. Patient is motivated to learn. Patient understands and agrees with treatment goals and plan. PT explains patient will be examined in standing, sitting, and lying down to see how their muscles and joints work. When they are ready, they will be asked to remove their underwear so PT can examine their perineum. The patient is also given the option of providing their own chaperone as one is not provided in our facility. The patient also has the right and is explained the right to defer or refuse any part of the evaluation or treatment including the internal exam. With the patient's consent, PT will use one gloved finger to gently assess the muscles of the pelvic floor, seeing how well it contracts and relaxes and if there is muscle symmetry. After, the patient will get dressed and PT and patient will discuss exam findings and plan of care. PT and patient discuss plan of care, schedule, attendance policy and HEP activities.  PELVIC MMT:   MMT eval  Vaginal 3/5, 7s, 5 reps  Internal Anal Sphincter   External Anal Sphincter   Puborectalis   Diastasis Recti   (Blank rows = not tested)  TONE: WFL  PROLAPSE: Not seen in hooklying with cough  TODAY'S TREATMENT:                                                                                                                              DATE:   06/29/23 EVAL Examination completed, findings reviewed, pt educated on POC, HEP, and urge drill and bladder irritants. Pt motivated to participate in PT and agreeable to attempt recommendations.    If treatment provided at initial evaluation, no treatment charged due to lack of authorization.       PATIENT EDUCATION:  Education details: [id number] Person educated: Patient Education method: Explanation, Demonstration, Tactile cues, Verbal cues, and Handouts Education comprehension: verbalized understanding and returned demonstration  HOME EXERCISE PROGRAM: [id number]  ASSESSMENT:  CLINICAL IMPRESSION: Patient is a 47 y.o. female  who was seen today for physical therapy evaluation and treatment for urge incontinence. Pt found to have decreased flexibility in spine and hips, decreased hip strength. Patient consented to internal pelvic floor assessment vaginally this date and found to have decreased strength, endurance, and coordination. Patient benefited from verbal cues for improved technique with pelvic floor contractions and coordination with breathing. However pt demonstrated poor ability to coordination exhale with contraction despite max cues and reps. Pt able to complete proper upward lift and squeeze technique but with inhale. Pt educated on this and given HEP based on impairments and urge drill. Both reviewed during session. Pt would benefit from additional PT to further address deficits.    OBJECTIVE IMPAIRMENTS: decreased activity tolerance, decreased coordination, decreased endurance, decreased mobility, decreased strength, impaired flexibility, improper body mechanics, and postural dysfunction.   ACTIVITY LIMITATIONS: continence  PARTICIPATION LIMITATIONS: community  activity  PERSONAL FACTORS: Time since onset of injury/illness/exacerbation and 1 comorbidity: x2 vaginal births  are also affecting patient's functional outcome.   REHAB POTENTIAL: Good  CLINICAL DECISION MAKING: Stable/uncomplicated  EVALUATION COMPLEXITY: Low   GOALS: Goals reviewed with patient? Yes  SHORT TERM GOALS: Target date: 07/27/23  Pt to be I with HEP.  Baseline: Goal status: INITIAL  2.  Pt to demonstrate improved coordination of pelvic floor and breathing mechanics with 10# squat with appropriate synergistic patterns to decrease pain and leakage at least 50% of the time.    Baseline:  Goal status: INITIAL  3.  Pt will decrease frequency of nightly trips to the bathroom to 3 or less in order to get restful sleep.   Baseline:  Goal status: INITIAL  4.  Pt will be independent with the urge suppression technique, and double voiding in order to improve bladder habits and decrease urinary incontinence.   Baseline:  Goal status: INITIAL   LONG TERM GOALS: Target date: 10/27/23  Pt to be I with advanced HEP.  Baseline:  Goal status: INITIAL  2.  Pt to demonstrate improved coordination of pelvic floor and breathing mechanics with 15# squat with appropriate synergistic patterns to decrease pain and leakage at least  75% of the time.    Baseline:  Goal status: INITIAL  3.  Pt will have 75% less urgency due to bladder retraining and strengthening  Baseline:  Goal status: INITIAL  4.  Pt will decrease frequency of nightly trips to the bathroom to 2 or less in order to get restful sleep.   Baseline:  Goal status: INITIAL  5.  Pt will demonstrate 4/5 pelvic floor muscle strength with appropriate coordination in order to decrease urinary incontinence, urgency and frequency.   Baseline:  Goal status: INITIAL    PLAN:  PT FREQUENCY: 1x/week  PT DURATION:  6 sessions  PLANNED INTERVENTIONS: 97110-Therapeutic exercises, 97530- Therapeutic activity, 97112-  Neuromuscular re-education, 97535- Self Care, [id number]- Manual therapy, Patient/Family education, Taping, Dry Needling, Joint mobilization, Spinal mobilization, Scar mobilization, Cryotherapy, Moist heat, and Biofeedback  PLAN FOR NEXT SESSION: internal as needed, biofeedback, breathing mechanics, hips and core strengthening, pelvic floor strengthening and coordination with and without activity.    Otelia Sergeant, PT, DPT 11/07/243:41 PM

## 2023-07-03 ENCOUNTER — Encounter: Payer: Self-pay | Admitting: Internal Medicine

## 2023-07-03 ENCOUNTER — Ambulatory Visit: Payer: Medicaid Other | Admitting: Internal Medicine

## 2023-07-03 ENCOUNTER — Other Ambulatory Visit: Payer: Self-pay

## 2023-07-03 VITALS — BP 125/68 | HR 75 | Temp 97.8°F | Ht 63.0 in | Wt 181.9 lb

## 2023-07-03 DIAGNOSIS — E66811 Obesity, class 1: Secondary | ICD-10-CM | POA: Diagnosis not present

## 2023-07-03 DIAGNOSIS — R635 Abnormal weight gain: Secondary | ICD-10-CM

## 2023-07-03 DIAGNOSIS — N3941 Urge incontinence: Secondary | ICD-10-CM | POA: Diagnosis present

## 2023-07-03 DIAGNOSIS — R7303 Prediabetes: Secondary | ICD-10-CM

## 2023-07-03 DIAGNOSIS — L309 Dermatitis, unspecified: Secondary | ICD-10-CM | POA: Diagnosis not present

## 2023-07-03 DIAGNOSIS — N951 Menopausal and female climacteric states: Secondary | ICD-10-CM

## 2023-07-03 DIAGNOSIS — Z6832 Body mass index (BMI) 32.0-32.9, adult: Secondary | ICD-10-CM

## 2023-07-03 MED ORDER — SEMAGLUTIDE-WEIGHT MANAGEMENT 2.4 MG/0.75ML ~~LOC~~ SOAJ: 2.4000 mg | SUBCUTANEOUS | 0 refills | Status: DC

## 2023-07-03 MED ORDER — SEMAGLUTIDE-WEIGHT MANAGEMENT 0.5 MG/0.5ML ~~LOC~~ SOAJ
0.5000 mg | SUBCUTANEOUS | 0 refills | Status: DC
Start: 2023-08-01 — End: 2023-07-17

## 2023-07-03 MED ORDER — SEMAGLUTIDE-WEIGHT MANAGEMENT 1.7 MG/0.75ML ~~LOC~~ SOAJ
1.7000 mg | SUBCUTANEOUS | 0 refills | Status: DC
Start: 2023-09-28 — End: 2023-07-17

## 2023-07-03 MED ORDER — OXYBUTYNIN CHLORIDE ER 5 MG PO TB24: 5.0000 mg | ORAL_TABLET | Freq: Every day | ORAL | 3 refills | Status: DC

## 2023-07-03 MED ORDER — SEMAGLUTIDE-WEIGHT MANAGEMENT 0.25 MG/0.5ML ~~LOC~~ SOAJ
SUBCUTANEOUS | 0 refills | Status: DC
Start: 2023-07-03 — End: 2023-07-03

## 2023-07-03 MED ORDER — SEMAGLUTIDE-WEIGHT MANAGEMENT 1 MG/0.5ML ~~LOC~~ SOAJ
1.0000 mg | SUBCUTANEOUS | 0 refills | Status: DC
Start: 2023-08-30 — End: 2023-07-17

## 2023-07-03 MED ORDER — SEMAGLUTIDE-WEIGHT MANAGEMENT 0.25 MG/0.5ML ~~LOC~~ SOAJ
0.2500 mg | SUBCUTANEOUS | 0 refills | Status: DC
Start: 2023-07-03 — End: 2023-07-17

## 2023-07-03 MED ORDER — TACROLIMUS 0.03 % EX OINT
TOPICAL_OINTMENT | CUTANEOUS | 0 refills | Status: DC
Start: 2023-07-03 — End: 2023-07-04

## 2023-07-03 NOTE — Progress Notes (Unsigned)
Subjective:  CC: urge incontinence, eczema  HPI:  [name] is a 47 y.o. female with a past medical history stated below and presents today for follow-up on urge incontinence and eczema.   Please see problem based assessment and plan for additional details.  Past Medical History:  Diagnosis Date   Acne vulgaris    Depression    Dyshidrotic eczema    Migraine 01/12/2017   Urge incontinence     Current Outpatient Medications on File Prior to Visit  Medication Sig Dispense Refill   traZODone (DESYREL) 50 MG tablet Take 1 tablet (50 mg total) by mouth at bedtime. Take 1-2 tablets at night as needed for sleep 90 tablet 2   No current facility-administered medications on file prior to visit.    Family History  Problem Relation Age of Onset   Breast cancer Mother 24   Stroke Mother        age 33 when she had the stroke   Hyperlipidemia Father     Social History   Socioeconomic History   Marital status: Single    Spouse name: Not on file   Number of children: Not on file   Years of education: Not on file   Highest education level: Not on file  Occupational History   Not on file  Tobacco Use   Smoking status: Never   Smokeless tobacco: Never  Substance and Sexual Activity   Alcohol use: Yes    Alcohol/week: 0.0 standard drinks of alcohol    Comment: Occasionally.   Drug use: Never   Sexual activity: Not on file  Other Topics Concern   Not on file  Social History Narrative   Has 2 children.   Father's children passed away from ruptured aneurysm in 2005.   Works at [employer].   Social Determinants of Health   Financial Resource Strain: Low Risk  (05/05/2023)   Overall Financial Resource Strain (CARDIA)    Difficulty of Paying Living Expenses: Not hard at all  Food Insecurity: No Food Insecurity (05/05/2023)   Hunger Vital Sign    Worried About Running Out of Food in the Last Year: Never true    Ran Out of Food in the Last Year: Never true   Transportation Needs: No Transportation Needs (05/05/2023)   PRAPARE - Administrator, Civil Service (Medical): No    Lack of Transportation (Non-Medical): No  Physical Activity: Sufficiently Active (05/05/2023)   Exercise Vital Sign    Days of Exercise per Week: 3 days    Minutes of Exercise per Session: 90 min  Stress: No Stress Concern Present (05/05/2023)   [employer] of Occupational Health - Occupational Stress Questionnaire    Feeling of Stress : Not at all  Social Connections: Moderately Integrated (05/05/2023)   Social Connection and Isolation Panel [NHANES]    Frequency of Communication with Friends and Family: More than three times a week    Frequency of Social Gatherings with Friends and Family: More than three times a week    Attends Religious Services: More than 4 times per year    Active Member of [employer] or Organizations: Yes    Attends Banker Meetings: Never    Marital Status: Never married  Intimate Partner Violence: Not At Risk (05/05/2023)   Humiliation, Afraid, Rape, and Kick questionnaire    Fear of Current or Ex-Partner: No    Emotionally Abused: No    Physically Abused: No    Sexually  Abused: No    Review of Systems: ROS negative except for what is noted on the assessment and plan.  Objective:   Vitals:   07/03/23 0915  BP: 125/68  Pulse: 75  Temp: 97.8 F (36.6 C)  TempSrc: Oral  SpO2: 100%  Weight: 181 lb 14.4 oz (82.5 kg)  Height: 5\' 3"  (1.6 m)    Physical Exam: Constitutional: well-appearing  Cardiovascular: regular rate and rhythm, no m/r/g Pulmonary/Chest: normal work of breathing on room air, lungs clear to auscultation bilaterally Abdominal: soft, non-tender, non-distended MSK: normal bulk and tone Neurological: normal gait Skin: warm and dry  Assessment & Plan:  Pre-diabetes Lab Results  Component Value Date   HGBA1C 5.7 (H) 04/06/2023   Patient with BMI of 32 consistent with class 1 obesity. She  also has history of pre-diabetes and urge incontinence that are both related to BMI. She has been exercising consistently and maintaining a well balanced diet for the last 6 months but has had a 20 lbs weight gain since 08/2021.  A: Lifestyle modifications have not lead to successful weight loss. Co morbidities with obesity include pre-diabetes and urge incontinence. P: Start wegovy    Urge incontinence She has had urge incontinence for the last several months. At last visit in 9/24 she had been doing kegel exercises and was referred to PT pelvic which she has appointment with on 11/12. She has leakage daily and has trouble making it to bathroom when she goes out to do errands. She denies urinary leakage with coughing, sneezing, and laughing. A: Incontinence is affecting quality of life. She has tried at home pelvic floor exercise without benefit. P: Starting pelvic floor PT Start oxybutynin 5 mg  Eczema She saw dermatology in the past for this. She was using high dose steroid cream with triamcinolone cream 0.1% but this is no longer helping with rash. She was sent in calcineurin inhibitor but was not able to pick this up due to her insurance not having medication. P: Tacrolimus 0.03% cream use 2 times daily until rash improves Referral placed to derm 8/24 that was approved   Menopausal vasomotor syndrome She has not been taking paxil. Will remove from medication list and plan to revisit at follow-up.   Patient discussed with Dr. Percell Boston Pansey Pinheiro, D.O. [facility]  PGY-3 Pager: [phone]  Phone: [phone] Date 07/04/2023  Time 1:34 PM

## 2023-07-03 NOTE — Patient Instructions (Signed)
Thank you, [name] for allowing [place] to provide your care today.   Eczema I sent in an ointment called tacrolimus. Please call if this is not at pharmacy.  Weight I have sent in wegovy again and included pre-diabetes in indication.  Urinary symptoms I sent in oxybutynin. Take this daily and go to pelvic floor PT  I have ordered the following medication/changed the following medications:   Stop the following medications: Medications Discontinued During This Encounter  Medication Reason   pimecrolimus (ELIDEL) 1 % cream    PARoxetine (PAXIL CR) 25 MG 24 hr tablet    Semaglutide-Weight Management 0.25 MG/0.5ML SOAJ Reorder     Start the following medications: Meds ordered this encounter  Medications   tacrolimus (PROTOPIC) 0.03 % ointment    Sig: Apply thin layer 2 times daily, discontinue when symptoms improve    Dispense:  30 g    Refill:  0   oxybutynin (DITROPAN-XL) 5 MG 24 hr tablet    Sig: Take 1 tablet (5 mg total) by mouth at bedtime.    Dispense:  30 tablet    Refill:  3   Semaglutide-Weight Management 0.25 MG/0.5ML SOAJ    Sig: Inject 0.25 mg into the skin once a week for 28 days, THEN 0.5 mg once a week for 28 days, THEN 1 mg once a week for 28 days, THEN 1.75 mg once a week for 28 days, THEN 2.5 mg once a week for 28 days.    Dispense:  2 mL    Refill:  0     Follow up:  4 weeks    We look forward to seeing you next time. Please call our clinic at [phone] if you have any questions or concerns. The best time to call is Monday-Friday from 9am-4pm, but there is someone available 24/7. If after hours or the weekend, call the main hospital number and ask for the Internal Medicine Resident On-Call. If you need medication refills, please notify your pharmacy one week in advance and they will send [place] a request.   Thank you for trusting me with your care. Wishing you the best!   Rudene Christians, DO [facility]

## 2023-07-04 ENCOUNTER — Encounter (HOSPITAL_COMMUNITY): Payer: Self-pay

## 2023-07-04 ENCOUNTER — Encounter: Payer: Self-pay | Admitting: Internal Medicine

## 2023-07-04 ENCOUNTER — Ambulatory Visit: Payer: Medicaid Other | Admitting: Physical Therapy

## 2023-07-04 ENCOUNTER — Other Ambulatory Visit (HOSPITAL_COMMUNITY): Payer: Self-pay

## 2023-07-04 DIAGNOSIS — R293 Abnormal posture: Secondary | ICD-10-CM

## 2023-07-04 DIAGNOSIS — R7303 Prediabetes: Secondary | ICD-10-CM | POA: Insufficient documentation

## 2023-07-04 DIAGNOSIS — N3941 Urge incontinence: Secondary | ICD-10-CM | POA: Diagnosis not present

## 2023-07-04 DIAGNOSIS — E66811 Obesity, class 1: Secondary | ICD-10-CM

## 2023-07-04 DIAGNOSIS — M6281 Muscle weakness (generalized): Secondary | ICD-10-CM

## 2023-07-04 DIAGNOSIS — R279 Unspecified lack of coordination: Secondary | ICD-10-CM

## 2023-07-04 HISTORY — DX: Obesity, class 1: E66.811

## 2023-07-04 MED ORDER — OXYBUTYNIN CHLORIDE ER 5 MG PO TB24
5.0000 mg | ORAL_TABLET | Freq: Every day | ORAL | 3 refills | Status: DC
  Filled 2023-07-04 – 2023-07-17 (×2): qty 30, 30d supply, fill #0

## 2023-07-04 MED ORDER — TACROLIMUS 0.03 % EX OINT
1.0000 | TOPICAL_OINTMENT | Freq: Two times a day (BID) | CUTANEOUS | 0 refills | Status: DC
Start: 2023-07-04 — End: 2023-11-06
  Filled 2023-07-04 – 2023-07-17 (×2): qty 30, 30d supply, fill #0

## 2023-07-04 NOTE — Therapy (Addendum)
OUTPATIENT PHYSICAL THERAPY FEMALE PELVIC TREATMENT   Patient Name: [name] MRN: [id number] DOB:06/07/1976, 47 y.o., female Today's Date: 07/04/2023  END OF SESSION:  PT End of Session - 07/04/23 1058     Visit Number 2    Date for PT Re-Evaluation 10/27/23    Authorization Type MEDICAID [employer]    PT Start Time 1100    PT Stop Time 1141    PT Time Calculation (min) 41 min    Activity Tolerance Patient tolerated treatment well    Behavior During Therapy WFL for tasks assessed/performed             Past Medical History:  Diagnosis Date   Acne vulgaris    Depression    Dyshidrotic eczema    Migraine 01/12/2017   Urge incontinence    Past Surgical History:  Procedure Laterality Date   TUBAL LIGATION  06/2004   bilateral   Patient Active Problem List   Diagnosis Date Noted   Obesity (BMI 30.0-34.9) 07/04/2023   Pre-diabetes 07/04/2023   Health care maintenance 05/08/2022   Urge incontinence 06/24/2020   Eczema 02/03/2020   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   Pelvic pain 05/22/2018   Weight gain 05/22/2018   High risk heterosexual behavior 03/20/2017   Anxiety and depression 01/12/2017   Papanicolaou smear of cervix with positive high risk human papilloma virus (HPV) test 12/23/2015    PCP: Rudene Christians, DO  REFERRING PROVIDER: Mercie Eon, MD  REFERRING DIAG: N39.41 (ICD-10-CM) - Urge incontinence  THERAPY DIAG:  Muscle weakness (generalized)  Abnormal posture  Unspecified lack of coordination  Rationale for Evaluation and Treatment: Rehabilitation  ONSET DATE: 4 years  SUBJECTIVE:                                                                                                                                                                                           SUBJECTIVE STATEMENT: No change since eval.   Fluid intake: Yes: water - couples bottles here and there per pt, soda sometimes but not every day,  lemonade daily     PAIN:  Are you having pain? No   PRECAUTIONS: None  RED FLAGS: None   WEIGHT BEARING RESTRICTIONS: No  FALLS:  Has patient fallen in last 6 months? No  LIVING ENVIRONMENT: Lives with: lives with their family Lives in: House/apartment   OCCUPATION: hair and makeup  PLOF: Independent  PATIENT GOALS: to have less leakage  PERTINENT HISTORY:  Depression, tubal ligations, garderella vaginitis, cervicitis,Papanicolaou smear of cervix with positive high risk human papilloma virus (HPV) test Sexual abuse: No  BOWEL MOVEMENT: Pain with bowel movement:  No Type of bowel movement:Type (Bristol Stool Scale) 4, Frequency daily, and Strain No Fully empty rectum: No Leakage: No Pads: No Fiber supplement: No  URINATION: Pain with urination: No Fully empty bladder: Yes:   Stream: Strong Urgency: Yes: every time Frequency: around 2 hours during the day, 5+x night Leakage: Urge to void and Walking to the bathroom Pads: No  INTERCOURSE: Pain with intercourse:  hasn't been active in 4 years but wasn't painful Ability to have vaginal penetration:  Yes: but hasn't been active Climax: not painful Marinoff Scale: 0/3  PREGNANCY: Vaginal deliveries 2 Tearing Yes: needed stitches C-section deliveries 0 Currently pregnant No  PROLAPSE: None   OBJECTIVE:  Note: Objective measures were completed at Evaluation unless otherwise noted.  DIAGNOSTIC FINDINGS:    PATIENT SURVEYS:    PFIQ-7 33 summary score  COGNITION: Overall cognitive status: Within functional limits for tasks assessed     SENSATION: Light touch: Appears intact Proprioception: Appears intact  MUSCLE LENGTH: Bil hamstrings and adductors limited by 25%  LUMBAR SPECIAL TESTS:  WFL  FUNCTIONAL TESTS:  Bil knee valgus with functional squat  GAIT: WFL  POSTURE: rounded shoulders and anterior pelvic tilt  PELVIC ALIGNMENT: WFL  LUMBARAROM/PROM:  A/PROM A/PROM  eval   Flexion Limited by 25%  Extension WFL  Right lateral flexion WFL  Left lateral flexion WFL  Right rotation Limited by 25%  Left rotation Limited by 25%   (Blank rows = not tested)  LOWER EXTREMITY ROM: WFL  LOWER EXTREMITY MMT:  Bil hips grossly 4/5, knees 5/5  PALPATION:   General  no TTP but did have tension at bil lumbar paraspinals                External Perineal Exam no TTP, [facility]                             Internal Pelvic Floor no TTP  Patient confirms identification and approves PT to assess internal pelvic floor and treatment Yes No emotional/communication barriers or cognitive limitation. Patient is motivated to learn. Patient understands and agrees with treatment goals and plan. PT explains patient will be examined in standing, sitting, and lying down to see how their muscles and joints work. When they are ready, they will be asked to remove their underwear so PT can examine their perineum. The patient is also given the option of providing their own chaperone as one is not provided in our facility. The patient also has the right and is explained the right to defer or refuse any part of the evaluation or treatment including the internal exam. With the patient's consent, PT will use one gloved finger to gently assess the muscles of the pelvic floor, seeing how well it contracts and relaxes and if there is muscle symmetry. After, the patient will get dressed and PT and patient will discuss exam findings and plan of care. PT and patient discuss plan of care, schedule, attendance policy and HEP activities.  PELVIC MMT:   MMT eval  Vaginal 3/5, 7s, 5 reps  Internal Anal Sphincter   External Anal Sphincter   Puborectalis   Diastasis Recti   (Blank rows = not tested)        TONE: WFL  PROLAPSE: Not seen in hooklying with cough  TODAY'S TREATMENT:  DATE:   06/29/23 EVAL Examination completed, findings reviewed, pt educated on POC, HEP, and urge drill and bladder irritants. Pt motivated to participate in PT and agreeable to attempt recommendations.    07/04/23  Transverse abdominis contraction with exhale in hooklying x20 2x10 opp hand/knee ball press 2x01 bridges 8# + pelvic floor contraction 2x10 4# hip abduction with ball press sidelying  2x10 10# squats Pt educated on urge drill and bladder irritants X3 urge drills from sitting at mat table and walking 20' with urge drill to start in sitting, again in standing, and counting backward from 10 to improve control over bladder with strong urge  PATIENT EDUCATION:  Education details: [id number] Person educated: Patient Education method: Explanation, Demonstration, Tactile cues, Verbal cues, and Handouts Education comprehension: verbalized understanding and returned demonstration  HOME EXERCISE PROGRAM: [id number]  ASSESSMENT:  CLINICAL IMPRESSION: Patient presents for treatment, no change in symptoms since eval. Educated today on urge drill and bladder irritants, handouts given. Pt denied questions, completed all exercises today without leakage and good technique with cues for technique. Purpose to improve pelvic floor activation for strengthening, once started with exercises pt reports she had improved feeling of contraction of pelvic floor. Pt would benefit from additional PT to further address deficits.    OBJECTIVE IMPAIRMENTS: decreased activity tolerance, decreased coordination, decreased endurance, decreased mobility, decreased strength, impaired flexibility, improper body mechanics, and postural dysfunction.   ACTIVITY LIMITATIONS: continence  PARTICIPATION LIMITATIONS: community activity  PERSONAL FACTORS: Time since onset of injury/illness/exacerbation and 1 comorbidity: x2 vaginal births  are also affecting patient's functional outcome.   REHAB POTENTIAL:  Good  CLINICAL DECISION MAKING: Stable/uncomplicated  EVALUATION COMPLEXITY: Low   GOALS: Goals reviewed with patient? Yes  SHORT TERM GOALS: Target date: 07/27/23  Pt to be I with HEP.  Baseline: Goal status: INITIAL  2.  Pt to demonstrate improved coordination of pelvic floor and breathing mechanics with 10# squat with appropriate synergistic patterns to decrease pain and leakage at least 50% of the time.    Baseline:  Goal status: INITIAL  3.  Pt will decrease frequency of nightly trips to the bathroom to 3 or less in order to get restful sleep.   Baseline:  Goal status: INITIAL  4.  Pt will be independent with the urge suppression technique, and double voiding in order to improve bladder habits and decrease urinary incontinence.   Baseline:  Goal status: INITIAL   LONG TERM GOALS: Target date: 10/27/23  Pt to be I with advanced HEP.  Baseline:  Goal status: INITIAL  2.  Pt to demonstrate improved coordination of pelvic floor and breathing mechanics with 15# squat with appropriate synergistic patterns to decrease pain and leakage at least 75% of the time.    Baseline:  Goal status: INITIAL  3.  Pt will have 75% less urgency due to bladder retraining and strengthening  Baseline:  Goal status: INITIAL  4.  Pt will decrease frequency of nightly trips to the bathroom to 2 or less in order to get restful sleep.   Baseline:  Goal status: INITIAL  5.  Pt will demonstrate 4/5 pelvic floor muscle strength with appropriate coordination in order to decrease urinary incontinence, urgency and frequency.   Baseline:  Goal status: INITIAL    PLAN:  PT FREQUENCY: 1x/week  PT DURATION:  6 sessions  PLANNED INTERVENTIONS: 97110-Therapeutic exercises, 97530- Therapeutic activity, 97112- Neuromuscular re-education, 97535- Self Care, 30865- Manual therapy, Patient/Family education, Taping, Dry Needling, Joint mobilization, Spinal mobilization, Scar mobilization,  Cryotherapy,  Moist heat, and Biofeedback  PLAN FOR NEXT SESSION: internal as needed, biofeedback, breathing mechanics, hips and core strengthening, pelvic floor strengthening and coordination with and without activity.    Otelia Sergeant, PT, DPT 07/03/2410:47 AM   PHYSICAL THERAPY DISCHARGE SUMMARY  Visits from Start of Care: 2  Current functional level related to goals / functional outcomes: Pt reports she is feeling better via phone to staff and declined additional needs for PT, requesting DC    Remaining deficits: Pt reports no longer needing PT   Education / Equipment: HEP   Patient agrees to discharge. Patient goals were partially met. Patient is being discharged due to being pleased with the current functional level.  Otelia Sergeant, PT, DPT 10/09/2509:12 AM

## 2023-07-04 NOTE — Assessment & Plan Note (Addendum)
Lab Results  Component Value Date   HGBA1C 5.7 (H) 04/06/2023   Patient with BMI of 32 consistent with class 1 obesity. She also has history of pre-diabetes and urge incontinence that are both related to BMI. She has been exercising consistently and maintaining a well balanced diet for the last 6 months but has had a 20 lbs weight gain since 08/2021.  A: Lifestyle modifications have not lead to successful weight loss. Co morbidities with obesity include pre-diabetes and urge incontinence. P: Start wegovy

## 2023-07-04 NOTE — Addendum Note (Signed)
Addended by: Lucille Passy on: 07/04/2023 04:48 PM   Modules accepted: Orders

## 2023-07-04 NOTE — Assessment & Plan Note (Signed)
She has not been taking paxil. Will remove from medication list and plan to revisit at follow-up.

## 2023-07-04 NOTE — Patient Instructions (Signed)
Urge Incontinence  Ideal urination frequency is every 2-4 wakeful hours, which equates to 5-8 times within a 24-hour period.   Urge incontinence is leakage that occurs when the bladder muscle contracts, creating a sudden need to go before getting to the bathroom.   Going too often when your bladder isn't actually full can disrupt the body's automatic signals to store and hold urine longer, which will increase urgency/frequency.  In this case, the bladder "is running the show" and strategies can be learned to retrain this pattern.   One should be able to control the first urge to urinate, at around 150mL.  The bladder can hold up to a "grande latte," or 400mL. To help you gain control, practice the Urge Drill below when urgency strikes.  This drill will help retrain your bladder signals and allow you to store and hold urine longer.  The overall goal is to stretch out your time between voids to reach a more manageable voiding schedule.    Practice your "quick flicks" often throughout the day (each waking hour) even when you don't need feel the urge to go.  This will help strengthen your pelvic floor muscles, making them more effective in controlling leakage.  Urge Drill  When you feel an urge to go, follow these steps to regain control: Stop what you are doing and be still Take one deep breath, directing your air into your abdomen Think an affirming thought, such as "I've got this." Do 5 quick flicks of your pelvic floor Walk with control to the bathroom to void, or delay voiding   Bladder Irritants  Certain foods and beverages can be irritating to the bladder.  Avoiding these irritants may decrease your symptoms of urinary urgency, frequency or bladder pain.  Even reducing your intake can help with your symptoms.  Not everyone is sensitive to all bladder irritants, so you may consider focusing on one irritant at a time, removing or reducing your intake of that irritant for 7-10 days to see if  this change helps your symptoms.  Water intake is also very important.  Below is a list of bladder irritants.  Drinks: alcohol, carbonated beverages, caffeinated beverages such as coffee and tea, drinks with artificial sweeteners, citrus juices, apple juice, tomato juice  Foods: tomatoes and tomato based foods, spicy food, sugar and artificial sweeteners, vinegar, chocolate, raw onion, apples, citrus fruits, pineapple, cranberries, tomatoes, strawberries, plums, peaches, cantaloupe  Other: acidic urine (too concentrated) - see water intake info below  Substitutes you can try that are NOT irritating to the bladder: cooked onion, pears, papayas, sun-brewed decaf teas, watermelons, non-citrus herbal teas, apricots, kava and low-acid instant drinks (Postum).    WATER INTAKE: Remember to drink lots of water (aim for fluid intake of half your body weight with 2/3 of fluids being water).  You may be limiting fluids due to fear of leakage, but this can actually worsen urgency symptoms due to highly concentrated urine.  Water helps balance the pH of your urine so it doesn't become too acidic - acidic urine is a bladder irritant!    

## 2023-07-04 NOTE — Assessment & Plan Note (Addendum)
She saw dermatology in the past for this. She was using high dose steroid cream with triamcinolone cream 0.1% but this is no longer helping with rash. She was sent in calcineurin inhibitor but was not able to pick this up due to her insurance not having medication. P: Tacrolimus 0.03% cream use 2 times daily until rash improves Referral placed to derm 8/24 that was approved

## 2023-07-04 NOTE — Assessment & Plan Note (Addendum)
She has had urge incontinence for the last several months. At last visit in 9/24 she had been doing kegel exercises and was referred to PT pelvic which she has appointment with on 11/12. She has leakage daily and has trouble making it to bathroom when she goes out to do errands. She denies urinary leakage with coughing, sneezing, and laughing. A: Incontinence is affecting quality of life. She has tried at home pelvic floor exercise without benefit. P: Starting pelvic floor PT Start oxybutynin 5 mg

## 2023-07-05 NOTE — Progress Notes (Signed)
Internal Medicine Clinic Attending  Case discussed with the resident at the time of the visit.  We reviewed the resident's history and exam and pertinent patient test results.  I agree with the assessment, diagnosis, and plan of care documented in the resident's note.  Debe Coder, MD

## 2023-07-06 ENCOUNTER — Telehealth: Payer: Self-pay

## 2023-07-06 NOTE — Telephone Encounter (Signed)
Decision:Denied  On 07/06/2023, we asked your provider for the following important facts or documents: Per your health plan's criteria, this drug is covered if you meet the following: Your doctor submits medical records (for example, chart notes, assessments, treatment plans, monitoring plans) confirming you are currently on and will continue lifestyle changes (including structured nutrition and physical activity, unless physical activity is not clinically appropriate at the time the requested therapy commences). The information provided does not show that you meet the criteria listed above. Please speak with your doctor about your choices. This decision was made per the [employer] [facility] Guideline

## 2023-07-06 NOTE — Telephone Encounter (Addendum)
Prior Authorization for patient [name] 1MG /0.5ML) came through on cover my meds was submitted with last office notes awaiting approval or denial. This is a resubmit from the initial authorization.  [id number]

## 2023-07-11 ENCOUNTER — Encounter: Payer: Medicaid Other | Admitting: Physical Therapy

## 2023-07-14 NOTE — Telephone Encounter (Signed)
Hey Dr.Masters you can write a letter to me stating the reason of appealing. Make sure to include lifestyle changes,nutrition and physical activity and her current weight and BMI. I Can print it out then fax it to her insurance.

## 2023-07-17 ENCOUNTER — Other Ambulatory Visit: Payer: Self-pay | Admitting: Internal Medicine

## 2023-07-17 ENCOUNTER — Other Ambulatory Visit (HOSPITAL_COMMUNITY): Payer: Self-pay

## 2023-07-17 DIAGNOSIS — R635 Abnormal weight gain: Secondary | ICD-10-CM

## 2023-07-17 DIAGNOSIS — R7303 Prediabetes: Secondary | ICD-10-CM

## 2023-07-18 ENCOUNTER — Other Ambulatory Visit: Payer: Self-pay

## 2023-07-18 ENCOUNTER — Encounter: Payer: Medicaid Other | Admitting: Physical Therapy

## 2023-07-18 ENCOUNTER — Other Ambulatory Visit (HOSPITAL_COMMUNITY): Payer: Self-pay

## 2023-07-18 ENCOUNTER — Telehealth: Payer: Self-pay

## 2023-07-18 ENCOUNTER — Other Ambulatory Visit (HOSPITAL_BASED_OUTPATIENT_CLINIC_OR_DEPARTMENT_OTHER): Payer: Self-pay

## 2023-07-18 MED ORDER — SEMAGLUTIDE-WEIGHT MANAGEMENT 1 MG/0.5ML ~~LOC~~ SOAJ
1.0000 mg | SUBCUTANEOUS | 0 refills | Status: AC
  Filled 2023-07-18 – 2023-09-21 (×2): qty 2, 28d supply, fill #0

## 2023-07-18 MED ORDER — SEMAGLUTIDE-WEIGHT MANAGEMENT 1.7 MG/0.75ML ~~LOC~~ SOAJ
1.7000 mg | SUBCUTANEOUS | 0 refills | Status: DC
Start: 2023-09-28 — End: 2023-11-08
  Filled 2023-07-18 – 2023-10-23 (×2): qty 3, 28d supply, fill #0

## 2023-07-18 MED ORDER — SEMAGLUTIDE-WEIGHT MANAGEMENT 2.4 MG/0.75ML ~~LOC~~ SOAJ
2.4000 mg | SUBCUTANEOUS | 0 refills | Status: DC
  Filled 2023-07-18: qty 3, 28d supply, fill #0

## 2023-07-18 MED ORDER — SEMAGLUTIDE-WEIGHT MANAGEMENT 0.5 MG/0.5ML ~~LOC~~ SOAJ
0.5000 mg | SUBCUTANEOUS | 0 refills | Status: AC
  Filled 2023-07-18 – 2023-08-21 (×2): qty 2, 28d supply, fill #0

## 2023-07-18 MED ORDER — SEMAGLUTIDE-WEIGHT MANAGEMENT 0.25 MG/0.5ML ~~LOC~~ SOAJ
0.2500 mg | SUBCUTANEOUS | 0 refills | Status: AC
Start: 2023-07-18 — End: 2023-08-23
  Filled 2023-07-18 – 2023-07-26 (×2): qty 2, 28d supply, fill #0

## 2023-07-18 NOTE — Telephone Encounter (Signed)
Patient called all in for refills.  Pharmacy just needs to know which is needed and to sent in refill for those.

## 2023-07-18 NOTE — Telephone Encounter (Signed)
Prior Authorization for patient [name] 0.25MG /0.5ML auto-injectors) came through on cover my meds was submitted with last office notes and labs awaiting approval or denial.  KEY:[id number]

## 2023-07-19 ENCOUNTER — Encounter (HOSPITAL_COMMUNITY): Payer: Self-pay

## 2023-07-19 ENCOUNTER — Other Ambulatory Visit (HOSPITAL_COMMUNITY): Payer: Self-pay

## 2023-07-19 NOTE — Telephone Encounter (Signed)
Decision:Denied

## 2023-07-25 ENCOUNTER — Encounter: Payer: Self-pay | Admitting: Internal Medicine

## 2023-07-25 NOTE — Telephone Encounter (Signed)
Prior Authorization has been resubmitted for appeal with supporting documents provided by Dr.Masters  JWJ:[id number]  Decision:Approved [name] (Key: [id number]) Reginal Lutes 0.25MG /0.5ML auto-injectors Form [employer] Electronic Prior Authorization Form (2017 NCPDP) Created Your prior authorization for Reginal Lutes has been approved! More Info Personalized support and financial assistance may be available through the [employer] program. For more information, and to see program requirements, click on the More Info button to the right.  Message from plan: Request Reference Number: [id number]. WEGOVY INJ 0.25MG  is approved through 01/23/2024. For further questions, call [employer] at [phone].Marland Kitchen Authorization Expiration Date: January 23, 2024.    I called and spoke with Connye Burkitt @ the pharmacy she ran the rx it went through successfully and stated the patients co pay will be $4. I called and spoke with the patient advising her of the approval and her co pay amount.

## 2023-07-26 ENCOUNTER — Other Ambulatory Visit (HOSPITAL_COMMUNITY): Payer: Self-pay

## 2023-08-03 ENCOUNTER — Telehealth: Payer: Self-pay | Admitting: Physical Therapy

## 2023-08-03 NOTE — Telephone Encounter (Signed)
Pt called, for appointment openings, did not answer VM left.  Otelia Sergeant, PT, DPT 08/02/2410:50 AM

## 2023-08-21 ENCOUNTER — Ambulatory Visit
Admission: RE | Admit: 2023-08-21 | Discharge: 2023-08-21 | Disposition: A | Discharge status: Home / Self Care | Payer: Medicaid Other | Source: Ambulatory Visit | Attending: Pediatrics | Admitting: Pediatrics

## 2023-08-21 ENCOUNTER — Other Ambulatory Visit: Payer: Self-pay | Admitting: Internal Medicine

## 2023-08-21 ENCOUNTER — Other Ambulatory Visit (HOSPITAL_COMMUNITY): Payer: Self-pay

## 2023-08-21 DIAGNOSIS — R928 Other abnormal and inconclusive findings on diagnostic imaging of breast: Secondary | ICD-10-CM

## 2023-09-12 ENCOUNTER — Ambulatory Visit: Payer: Medicaid Other | Admitting: Skilled Nursing Facility1

## 2023-09-21 ENCOUNTER — Ambulatory Visit: Payer: Medicaid Other | Attending: Internal Medicine | Admitting: Physical Therapy

## 2023-09-21 ENCOUNTER — Other Ambulatory Visit (HOSPITAL_COMMUNITY): Payer: Self-pay

## 2023-09-21 DIAGNOSIS — R279 Unspecified lack of coordination: Secondary | ICD-10-CM | POA: Insufficient documentation

## 2023-09-21 DIAGNOSIS — R293 Abnormal posture: Secondary | ICD-10-CM | POA: Insufficient documentation

## 2023-09-21 DIAGNOSIS — N3941 Urge incontinence: Secondary | ICD-10-CM | POA: Insufficient documentation

## 2023-09-21 DIAGNOSIS — M6281 Muscle weakness (generalized): Secondary | ICD-10-CM | POA: Insufficient documentation

## 2023-09-26 ENCOUNTER — Encounter: Payer: Medicaid Other | Admitting: Physical Therapy

## 2023-10-03 ENCOUNTER — Encounter: Payer: Medicaid Other | Admitting: Physical Therapy

## 2023-10-10 ENCOUNTER — Encounter: Payer: Medicaid Other | Admitting: Physical Therapy

## 2023-10-16 ENCOUNTER — Ambulatory Visit: Payer: Medicaid Other | Admitting: Skilled Nursing Facility1

## 2023-10-23 ENCOUNTER — Other Ambulatory Visit (HOSPITAL_COMMUNITY): Payer: Self-pay

## 2023-11-06 ENCOUNTER — Other Ambulatory Visit: Payer: Self-pay | Admitting: Internal Medicine

## 2023-11-06 ENCOUNTER — Ambulatory Visit: Payer: Self-pay | Admitting: Internal Medicine

## 2023-11-06 DIAGNOSIS — L309 Dermatitis, unspecified: Secondary | ICD-10-CM

## 2023-11-06 NOTE — Telephone Encounter (Signed)
 I called and talked with patient. She feels like she as doing better on the dose that was 2 increases ago. She is not currently at home and cannot tell me which dose she has been taking. P: I asked that she call clinic back tomorrow am once she is able to check what dose she has  been taking.  I will send in dose that is lower for patient to remain on. Could consider titrating up in the future is patient is interested but she may need a longer before increasing dose.

## 2023-11-06 NOTE — Telephone Encounter (Signed)
 Last Fill: 07/04/23  Last OV: 05/05/23 Next OV: 11/14/23  Routing to provider for review/authorization.

## 2023-11-06 NOTE — Telephone Encounter (Signed)
  Chief Complaint: Vision change, abdominal pain  Symptoms: abdominal pain, blurry vision Frequency: intermittent Pertinent Negatives: Patient denies headache, vomiting, fever, dizziness, all other symptoms.  Disposition: [] ED /[] Urgent Care (no appt availability in office) / [x] Appointment(In office/virtual)/ []  [facility]/ [] Home Care/ [] Refused Recommended Disposition /[] [place] [facility]/ [x]  Follow-up with PCP Additional Notes:  Since her increase dose in Wagovy on 10/27/23 she has no appetite, blurry vision, overall feels "blah". Feels like she is losing muscle. Intermittent abdominal pain has been ongoing but seems to be getting worse at night, this pain seems related to hunger but then she does not have a desire to eat.  She is due for her next Wagovy injection today and would like to skip this dose, advised this writer will send message to PCP that she would like to skip today's dose due to symptoms. Acute visit scheduled with PCP on 11/14/23. Please follow up with patient regarding her next Wagovy dose.    Message from Durant C sent at 11/06/2023  2:51 PM EDT  Copied From CRM [id number]. Reason for Triage: Patient states she is on the highest dosage of Wagovy (Semaglutide-Weight Management 2.4 MG/0.75ML SOAJ). She has been feeling nauseous, her vision is bothering her & experiencing a loss of appetite.   Reason for Disposition  Abdominal pain is a chronic symptom (recurrent or ongoing AND present > 4 weeks)  Protocols used: Abdominal Pain - Female-A-AH

## 2023-11-06 NOTE — Telephone Encounter (Signed)
 Will forward to Dr. Sloan Leiter.

## 2023-11-06 NOTE — Telephone Encounter (Signed)
 Copied from CRM [id number]. Topic: Clinical - Medication Refill >> Nov 06, 2023  2:51 PM Thomes Dinning wrote: Most Recent Primary Care Visit:  Provider: Rudene Christians  Department: [facility]  Visit Type: OPEN ESTABLISHED  Date: 07/03/2023  Medication:  tacrolimus (PROTOPIC) 0.03 % ointment  Has the patient contacted their pharmacy? No (Agent: If no, request that the patient contact the pharmacy for the refill. If patient does not wish to contact the pharmacy document the reason why and proceed with request.) (Agent: If yes, when and what did the pharmacy advise?)  Is this the correct pharmacy for this prescription? Yes If no, delete pharmacy and type the correct one.  This is the patient's preferred pharmacy:  [facility] [address], [place] - [address] [place] [address] Phone: [phone] Fax: [phone]  Has the prescription been filled recently? No  Is the patient out of the medication? Yes  Has the patient been seen for an appointment in the last year OR does the patient have an upcoming appointment? No  Can we respond through MyChart? Yes  Agent: Please be advised that Rx refills may take up to 3 business days. We ask that you follow-up with your pharmacy.

## 2023-11-07 MED ORDER — TACROLIMUS 0.03 % EX OINT: 1.0000 "application " | TOPICAL_OINTMENT | Freq: Two times a day (BID) | CUTANEOUS | 0 refills | Status: DC

## 2023-11-08 ENCOUNTER — Telehealth: Payer: Self-pay | Admitting: Internal Medicine

## 2023-11-08 DIAGNOSIS — R7303 Prediabetes: Secondary | ICD-10-CM

## 2023-11-08 DIAGNOSIS — E66811 Obesity, class 1: Secondary | ICD-10-CM

## 2023-11-08 MED ORDER — WEGOVY 0.5 MG/0.5ML ~~LOC~~ SOAJ: 0.5000 mg | SUBCUTANEOUS | 3 refills | Status: DC

## 2023-11-08 NOTE — Telephone Encounter (Signed)
 Copied from CRM [id number]. Topic: General - Other >> Nov 08, 2023 11:34 AM Alfred Levins wrote: Patient [name] is calling as requested, to let her provider know where she is at with her Reginal Lutes she is at 1.7.  She would also like a call back today, she requested a Nutritionist and has not heard from anyone. Please advise.

## 2023-11-08 NOTE — Telephone Encounter (Signed)
 Please address.

## 2023-11-08 NOTE — Telephone Encounter (Signed)
 I spoke with [name] on the phone. Patient's identity was confirmed using two patient specific identifiers. We discussed her Wegovy dosing.  The patient states at her current dose she has no appetite and is losing more weight than she would want to.  Discussed that this is an expected effect of the medication.  The patient also endorses some nausea and cramping.  She would like to dose reduced to 0.5 mg weekly as she felt that this was more optimal for her.  I will place orders for Wegovy 0.5 mg weekly.  We will continue to assess her at subsequent visits.

## 2023-11-09 ENCOUNTER — Telehealth: Payer: Self-pay | Admitting: Dietician

## 2023-11-09 NOTE — Telephone Encounter (Signed)
 Spoke with patient about her weight loss, meal planning and use of Wegovy per Dr. Geroge Baseman.  She states she feels weak and that Reginal Lutes is "a beast" due to her weight loss of 183-151# in the past 3 month and having no appetite. Her reported weight loss is more than the recommended 4-8# per month rate of weight loss.  Suggestions for keeping her weight stable right now by decreasing her Wegovy use (dose recently decrease) and focusing on building her strength and muscle back by resistance training and healthy meal planning (discussed balanced meals and snacks, increased fiber, healthy beverage options) . Offered her a referral to [facility] for more information and follow up but she declined this. Marland Kitchen

## 2023-11-14 ENCOUNTER — Ambulatory Visit: Payer: Self-pay | Admitting: Internal Medicine

## 2023-11-14 VITALS — BP 114/78 | HR 78 | Temp 98.0°F | Ht 63.0 in | Wt 153.2 lb

## 2023-11-14 DIAGNOSIS — R7303 Prediabetes: Secondary | ICD-10-CM

## 2023-11-14 DIAGNOSIS — N3941 Urge incontinence: Secondary | ICD-10-CM | POA: Diagnosis present

## 2023-11-14 DIAGNOSIS — L309 Dermatitis, unspecified: Secondary | ICD-10-CM

## 2023-11-14 DIAGNOSIS — Z23 Encounter for immunization: Secondary | ICD-10-CM

## 2023-11-14 DIAGNOSIS — N951 Menopausal and female climacteric states: Secondary | ICD-10-CM

## 2023-11-14 DIAGNOSIS — Z0000 Encounter for general adult medical examination without abnormal findings: Secondary | ICD-10-CM

## 2023-11-14 MED ORDER — VENLAFAXINE HCL ER 37.5 MG PO CP24: 37.5000 mg | ORAL_CAPSULE | Freq: Every day | ORAL | 2 refills | Status: DC

## 2023-11-14 NOTE — Assessment & Plan Note (Signed)
 She has had a 30 pound weight loss in the last 4 months.  She is not having nausea or diarrhea but feels like she does not have any appetite.  Would also want to slow down weight loss as she is losing muscle mass. P: Decrease Wegovy to 0.5 mg weekly Repeat hemoglobin A1c due this fall

## 2023-11-14 NOTE — Patient Instructions (Signed)
 Thank you, [name] for allowing [place] to provide your care today.   Weight loss Continue wegovy 0.5 mg weekly. This medication has one lower dose as well if you continue to have dramatic weight loss.  I have discontinue the medication for urinary incontinence as your symptoms have improved with weight loss.  Hot flashes Please start venlafaxine. This medication is to help with hot flashes.  Contact info for dermatology: Melanee Left, MD Dermatology [address] [place] [address] [phone] phone [phone] A  I have ordered the following medication/changed the following medications:   Stop the following medications: Medications Discontinued During This Encounter  Medication Reason   oxybutynin (DITROPAN-XL) 5 MG 24 hr tablet      Start the following medications: Meds ordered this encounter  Medications   venlafaxine XR (EFFEXOR XR) 37.5 MG 24 hr capsule    Sig: Take 1 capsule (37.5 mg total) by mouth daily.    Dispense:  30 capsule    Refill:  2     Follow up: 5-6 months to repeat blood work  We look forward to seeing you next time. Please call our clinic at [phone] if you have any questions or concerns. The best time to call is Monday-Friday from 9am-4pm, but there is someone available 24/7. If after hours or the weekend, call the main hospital number and ask for the Internal Medicine Resident On-Call. If you need medication refills, please notify your pharmacy one week in advance and they will send [place] a request.   Thank you for trusting me with your care. Wishing you the best!   Rudene Christians, DO [facility]

## 2023-11-14 NOTE — Assessment & Plan Note (Signed)
 She is average risk for colon cancer.  I reviewed with her indications for Cologuard versus colonoscopy.  She will talk with her family members and this note at next visit which 1 she would prefer to do.  Received tetanus shot during visit.

## 2023-11-14 NOTE — Progress Notes (Unsigned)
 Subjective:  CC: weight loss  HPI:  [name] is a 48 y.o. female with a past medical history of diabetes who presents today for weight loss.   Last office visit was 07/03/23. She was seen for prediabetes and urinary incontinence.  For her urinary incontinence she was sent to pelvic floor physical therapy as well as started on Wegovy to help with weight loss.  She presented to pelvic floor physical therapy appointments and has lost about 30 pounds in the last 4 months.  She no longer has difficulty with urinary incontinence.  She does feel like she has no appetite and is not able to finish kids meal.  Please see problem based assessment and plan for additional details.  Past Medical History:  Diagnosis Date   Acne vulgaris    Depression    Dyshidrotic eczema    Migraine 01/12/2017   Urge incontinence     MEDICATIONS:  Wegovy 1.7 mg weekly Tacrolimus ointment Trazodone 50 mg nightly  Family History  Problem Relation Age of Onset   Breast cancer Mother 61   Stroke Mother        age 6 when she had the stroke   Hyperlipidemia Father     Past Surgical History:  Procedure Laterality Date   TUBAL LIGATION  06/2004   bilateral     Social History   Socioeconomic History   Marital status: Single    Spouse name: Not on file   Number of children: Not on file   Years of education: Not on file   Highest education level: Not on file  Occupational History   Not on file  Tobacco Use   Smoking status: Never   Smokeless tobacco: Never  Substance and Sexual Activity   Alcohol use: Yes    Alcohol/week: 0.0 standard drinks of alcohol    Comment: Occasionally.   Drug use: Never   Sexual activity: Not on file  Other Topics Concern   Not on file  Social History Narrative   Has 2 children.   Father's children passed away from ruptured aneurysm in 2005.   Works at [employer].   Social Drivers of Corporate investment banker Strain: Low Risk  (05/05/2023)    Overall Financial Resource Strain (CARDIA)    Difficulty of Paying Living Expenses: Not hard at all  Food Insecurity: No Food Insecurity (05/05/2023)   Hunger Vital Sign    Worried About Running Out of Food in the Last Year: Never true    Ran Out of Food in the Last Year: Never true  Transportation Needs: No Transportation Needs (05/05/2023)   PRAPARE - Administrator, Civil Service (Medical): No    Lack of Transportation (Non-Medical): No  Physical Activity: Sufficiently Active (05/05/2023)   Exercise Vital Sign    Days of Exercise per Week: 3 days    Minutes of Exercise per Session: 90 min  Stress: No Stress Concern Present (05/05/2023)   [employer] of Occupational Health - Occupational Stress Questionnaire    Feeling of Stress : Not at all  Social Connections: Moderately Integrated (05/05/2023)   Social Connection and Isolation Panel [NHANES]    Frequency of Communication with Friends and Family: More than three times a week    Frequency of Social Gatherings with Friends and Family: More than three times a week    Attends Religious Services: More than 4 times per year    Active Member of [employer] or Organizations: Yes  Attends Banker Meetings: Never    Marital Status: Never married  Intimate Partner Violence: Not At Risk (05/05/2023)   Humiliation, Afraid, Rape, and Kick questionnaire    Fear of Current or Ex-Partner: No    Emotionally Abused: No    Physically Abused: No    Sexually Abused: No    Review of Systems: ROS negative except for what is noted on the assessment and plan.  Objective:   Vitals:   11/14/23 0917  BP: 114/78  Pulse: 78  Temp: 98 F (36.7 C)  TempSrc: Oral  SpO2: 100%  Weight: 153 lb 3.2 oz (69.5 kg)  Height: 5\' 3"  (1.6 m)    Physical Exam: Constitutional: well-appearing, in no acute distress Cardiovascular: regular rate and rhythm, no m/r/g Pulmonary/Chest: normal work of breathing on room air, lungs clear to  auscultation bilaterally Abdominal: soft, non-tender, non-distended MSK: normal bulk and tone Neurological: alert & oriented x 3,normal gait Skin: warm and dry  Assessment & Plan:  Urge incontinence Her symptoms with urinary incontinence have resolved after pelvic floor physical therapy and weight loss over the last few months.  She has stopped taking oxybutynin. P: Oxybutynin removed from med list  Pre-diabetes She has had a 30 pound weight loss in the last 4 months.  She is not having nausea or diarrhea but feels like she does not have any appetite.  Would also want to slow down weight loss as she is losing muscle mass. P: Decrease Wegovy to 0.5 mg weekly Repeat hemoglobin A1c due this fall  Eczema She has not gone back to dermatology yet but number provided in after visit summary.  After using tacrolimus her rash on her legs has solved.  Menopausal vasomotor syndrome She continues to have daily or multiple times a day hot flashes.  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  Would be interested in trying medication to help with her symptoms. P: Start venlafaxine 37.5 mg daily  Health care maintenance She is average risk for colon cancer.  I reviewed with her indications for Cologuard versus colonoscopy.  She will talk with her family members and this note at next visit which 1 she would prefer to do.  Received tetanus shot during visit.    Patient discussed with Dr. Hurshel Keys [name], D.O. [facility] Internal Medicine  PGY-3 Pager: [phone]  Phone: [phone] Date 11/14/2023  Time 10:04 AM

## 2023-11-14 NOTE — Assessment & Plan Note (Signed)
 She has not gone back to dermatology yet but number provided in after visit summary.  After using tacrolimus her rash on her legs has solved.

## 2023-11-14 NOTE — Assessment & Plan Note (Signed)
 Her symptoms with urinary incontinence have resolved after pelvic floor physical therapy and weight loss over the last few months.  She has stopped taking oxybutynin. P: Oxybutynin removed from med list

## 2023-11-14 NOTE — Assessment & Plan Note (Signed)
 She continues to have daily or multiple times a day hot flashes.  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  Would be interested in trying medication to help with her symptoms. P: Start venlafaxine 37.5 mg daily

## 2023-11-16 NOTE — Progress Notes (Signed)
 Internal Medicine Clinic Attending  Case discussed with the resident at the time of the visit.  We reviewed the resident's history and exam and pertinent patient test results.  I agree with the assessment, diagnosis, and plan of care documented in the resident's note.

## 2023-12-05 ENCOUNTER — Other Ambulatory Visit (HOSPITAL_COMMUNITY): Payer: Self-pay

## 2023-12-05 ENCOUNTER — Other Ambulatory Visit: Payer: Self-pay | Admitting: Internal Medicine

## 2023-12-05 DIAGNOSIS — R635 Abnormal weight gain: Secondary | ICD-10-CM

## 2023-12-05 DIAGNOSIS — R7303 Prediabetes: Secondary | ICD-10-CM

## 2023-12-05 DIAGNOSIS — E66811 Obesity, class 1: Secondary | ICD-10-CM

## 2023-12-05 MED ORDER — WEGOVY 0.5 MG/0.5ML ~~LOC~~ SOAJ
0.5000 mg | SUBCUTANEOUS | 3 refills | Status: DC
  Filled 2023-12-05: qty 2, 28d supply, fill #0
  Filled 2024-01-10: qty 2, 28d supply, fill #1

## 2023-12-05 NOTE — Telephone Encounter (Signed)
 Patient remains on wegovy 0.5 mg weekly. Refill sent

## 2023-12-05 NOTE — Addendum Note (Signed)
 Addended by: Bishop Bullock on: 12/05/2023 02:05 PM   Modules accepted: Orders

## 2023-12-05 NOTE — Telephone Encounter (Signed)
 Medication discontinued 11/08/23

## 2023-12-06 ENCOUNTER — Encounter (HOSPITAL_COMMUNITY): Payer: Self-pay

## 2023-12-06 ENCOUNTER — Other Ambulatory Visit (HOSPITAL_COMMUNITY): Payer: Self-pay

## 2024-01-10 ENCOUNTER — Encounter: Payer: Self-pay | Admitting: Student

## 2024-01-10 ENCOUNTER — Ambulatory Visit (INDEPENDENT_AMBULATORY_CARE_PROVIDER_SITE_OTHER): Payer: Self-pay | Admitting: Student

## 2024-01-10 ENCOUNTER — Other Ambulatory Visit (HOSPITAL_COMMUNITY): Payer: Self-pay

## 2024-01-10 ENCOUNTER — Other Ambulatory Visit: Payer: Self-pay

## 2024-01-10 VITALS — BP 101/57 | HR 75 | Temp 98.1°F | Ht 64.0 in | Wt 140.0 lb

## 2024-01-10 DIAGNOSIS — R1013 Epigastric pain: Secondary | ICD-10-CM

## 2024-01-10 DIAGNOSIS — R109 Unspecified abdominal pain: Secondary | ICD-10-CM | POA: Diagnosis present

## 2024-01-10 DIAGNOSIS — E66811 Obesity, class 1: Secondary | ICD-10-CM

## 2024-01-10 NOTE — Assessment & Plan Note (Signed)
 She is currently prescribed 0.5 mg of Wegovy  per week.  She has been taking this biweekly as she feels she is losing more weight than she would like and has had a reduced appetite.  Given her significant weight loss, patient would likely benefit from going down to 0.25 mg/week after completing her current Wegovy  dosage.  Given her current abdominal pain, encouraged to hold her Wegovy  for 1 week until her symptoms resolved. - Continue Wegovy  0.5 mg biweekly with plan of decreasing to 0.25 mg/week - Hold Wegovy  for 1 week

## 2024-01-10 NOTE — Progress Notes (Signed)
 CC: Abdominal pain  HPI: [name] is a 48 y.o. female living with a history stated below and presents today for abdominal pain. Please see problem based assessment and plan for additional details.  Past Medical History:  Diagnosis Date   Acne vulgaris    Depression    Dyshidrotic eczema    Migraine 01/12/2017   Urge incontinence     Current Outpatient Medications on File Prior to Visit  Medication Sig Dispense Refill   Semaglutide -Weight Management (WEGOVY ) 0.5 MG/0.5ML SOAJ Inject 0.5 mg into the skin once a week. 2 mL 3   tacrolimus  (PROTOPIC ) 0.03 % ointment Apply thin layer 2 times daily, discontinue when symptoms improve 30 g 0   traZODone  (DESYREL ) 50 MG tablet Take 1 tablet (50 mg total) by mouth at bedtime. Take 1-2 tablets at night as needed for sleep 90 tablet 2   venlafaxine  XR (EFFEXOR  XR) 37.5 MG 24 hr capsule Take 1 capsule (37.5 mg total) by mouth daily. 30 capsule 2   No current facility-administered medications on file prior to visit.    Family History  Problem Relation Age of Onset   Breast cancer Mother 20   Stroke Mother        age 24 when she had the stroke   Hyperlipidemia Father     Social History   Socioeconomic History   Marital status: Single    Spouse name: Not on file   Number of children: Not on file   Years of education: Not on file   Highest education level: Not on file  Occupational History   Not on file  Tobacco Use   Smoking status: Never   Smokeless tobacco: Never  Substance and Sexual Activity   Alcohol use: Yes    Alcohol/week: 0.0 standard drinks of alcohol    Comment: Occasionally.   Drug use: Never   Sexual activity: Not on file  Other Topics Concern   Not on file  Social History Narrative   Has 2 children.   Father's children passed away from ruptured aneurysm in 2005.   Works at [employer].   Social Drivers of Corporate investment banker Strain: Low Risk  (05/05/2023)   Overall Financial Resource  Strain (CARDIA)    Difficulty of Paying Living Expenses: Not hard at all  Food Insecurity: No Food Insecurity (05/05/2023)   Hunger Vital Sign    Worried About Running Out of Food in the Last Year: Never true    Ran Out of Food in the Last Year: Never true  Transportation Needs: No Transportation Needs (05/05/2023)   PRAPARE - Administrator, Civil Service (Medical): No    Lack of Transportation (Non-Medical): No  Physical Activity: Sufficiently Active (05/05/2023)   Exercise Vital Sign    Days of Exercise per Week: 3 days    Minutes of Exercise per Session: 90 min  Stress: No Stress Concern Present (05/05/2023)   [employer] of Occupational Health - Occupational Stress Questionnaire    Feeling of Stress : Not at all  Social Connections: Moderately Integrated (05/05/2023)   Social Connection and Isolation Panel [NHANES]    Frequency of Communication with Friends and Family: More than three times a week    Frequency of Social Gatherings with Friends and Family: More than three times a week    Attends Religious Services: More than 4 times per year    Active Member of [employer] or Organizations: Yes    Attends Club or  Organization Meetings: Never    Marital Status: Never married  Intimate Partner Violence: Not At Risk (05/05/2023)   Humiliation, Afraid, Rape, and Kick questionnaire    Fear of Current or Ex-Partner: No    Emotionally Abused: No    Physically Abused: No    Sexually Abused: No    Review of Systems: ROS negative except for what is noted on the assessment and plan.  Vitals:   01/10/24 0959 01/10/24 1005  BP: (!) 96/50 (!) 101/57  Pulse: 75 75  Temp: 98.1 F (36.7 C)   TempSrc: Oral   SpO2: 100%   Weight: 140 lb (63.5 kg)   Height: 5\' 4"  (1.626 m)     Physical Exam: Constitutional: well-appearing in no acute distress HENT: normocephalic atraumatic, mucous membranes moist Eyes: conjunctiva non-erythematous Neck: supple Cardiovascular: regular rate  and rhythm, no m/r/g Pulmonary/Chest: normal work of breathing on room air, lungs clear to auscultation bilaterally Abdominal: epigastric tenderness MSK: normal bulk and tone Neurological: alert & oriented x 3, 5/5 strength in bilateral upper and lower extremities, normal gait Skin: warm and dry  Assessment & Plan:   Abdominal pain Patient following up after she developed abdominal pain from her recent trip to [place] in which she consumed oysters. This has going on for a week. She has no diarrhea right now. She last passed a stool yesterday that was brown in appearance. She also had nausea/vomiting about a week ago. Her vomiting has resolved but she is still having some nausea. Right now, she is only drinking soda. For her diet, she has only been eating Popeye's, Bojangle's, and Domino's.  Given the patient's low-normal blood pressure, I am concerned that she is dehydrated and may have experienced food poisoning while in [place].  She was counseled on improving her diet and fluid intake and to reach back out to the clinic if her signs symptoms do not improve. - Counseled on improved diet and fluid intake  Obesity (BMI 30.0-34.9) She is currently prescribed 0.5 mg of Wegovy  per week.  She has been taking this biweekly as she feels she is losing more weight than she would like and has had a reduced appetite.  Given her significant weight loss, patient would likely benefit from going down to 0.25 mg/week after completing her current Wegovy  dosage.  Given her current abdominal pain, encouraged to hold her Wegovy  for 1 week until her symptoms resolved. - Continue Wegovy  0.5 mg biweekly with plan of decreasing to 0.25 mg/week - Hold Wegovy  for 1 week  Patient discussed with Dr. Brigitte Canard, MD  [facility] Internal Medicine, PGY-1 Date 01/10/2024 Time 10:36 AM

## 2024-01-10 NOTE — Progress Notes (Signed)
 Internal Medicine Clinic Attending  Case discussed with the resident at the time of the visit.  We reviewed the resident's history and exam and pertinent patient test results.  I agree with the assessment, diagnosis, and plan of care documented in the resident's note.

## 2024-01-10 NOTE — Assessment & Plan Note (Signed)
 Patient following up after she developed abdominal pain from her recent trip to [place] in which she consumed oysters. This has going on for a week. She has no diarrhea right now. She last passed a stool yesterday that was brown in appearance. She also had nausea/vomiting about a week ago. Her vomiting has resolved but she is still having some nausea. Right now, she is only drinking soda. For her diet, she has only been eating Popeye's, Bojangle's, and Domino's.  Given the patient's low-normal blood pressure, I am concerned that she is dehydrated and may have experienced food poisoning while in [place].  She was counseled on improving her diet and fluid intake and to reach back out to the clinic if her signs symptoms do not improve. - Counseled on improved diet and fluid intake

## 2024-01-10 NOTE — Patient Instructions (Signed)
 Thank you so much for coming to the clinic today!   Hold your next dose of Wegovy .   We will see you in three months. Please let us  know if your symptoms don't improve after improving your diet.   If you have any questions please feel free to the call the clinic at anytime at [phone]. It was a pleasure seeing you!  Best, Dr. Carolee Churchman

## 2024-01-29 ENCOUNTER — Other Ambulatory Visit: Payer: Self-pay | Admitting: Internal Medicine

## 2024-01-29 ENCOUNTER — Telehealth: Payer: Self-pay | Admitting: *Deleted

## 2024-01-29 ENCOUNTER — Telehealth: Payer: Self-pay

## 2024-01-29 DIAGNOSIS — L309 Dermatitis, unspecified: Secondary | ICD-10-CM

## 2024-01-29 DIAGNOSIS — R7303 Prediabetes: Secondary | ICD-10-CM

## 2024-01-29 DIAGNOSIS — E66811 Obesity, class 1: Secondary | ICD-10-CM

## 2024-01-29 MED ORDER — WEGOVY 0.25 MG/0.5ML ~~LOC~~ SOAJ: 0.2500 mg | SUBCUTANEOUS | 3 refills | Status: DC

## 2024-01-29 NOTE — Telephone Encounter (Signed)
 Copied from CRM [id number]. Topic: Clinical - Medical Advice >> Jan 29, 2024  9:38 AM Adrianna P wrote: Reason for CRM: Patient is on Wegovy , and she would like to go down to her original first dosage. She would also like her doctor to contact her regarding this, Please call [phone]

## 2024-01-29 NOTE — Telephone Encounter (Signed)
 Prior Authorization for patient (Wegovy  0.25MG /0.5ML auto-injectors) came through on cover my meds was submitted with last office notes and note encounter from 01/29/24, awaiting approval or denial.  KEY:[id number]

## 2024-01-29 NOTE — Telephone Encounter (Signed)
 Last Fill: 11/07/23  Last OV: 01/10/24 Next OV: None Scheduled  Routing to provider for review/authorization.

## 2024-01-29 NOTE — Telephone Encounter (Signed)
 Copied from CRM [id number]. Topic: Clinical - Medication Refill >> Jan 29, 2024  9:41 AM Adrianna P wrote: Medication: tacrolimus  (PROTOPIC ) 0.03 % ointment  Has the patient contacted their pharmacy? No (Agent: If no, request that the patient contact the pharmacy for the refill. If patient does not wish to contact the pharmacy document the reason why and proceed with request.) (Agent: If yes, when and what did the pharmacy advise?)  This is the patient's preferred pharmacy:  [facility] [address], [place] - [address] [place] [address] Phone: [phone] Fax: [phone]  [place] [facility] [address], [address] [place] [address] Phone: [phone] Fax: [phone]  Is this the correct pharmacy for this prescription? Yes If no, delete pharmacy and type the correct one.   Has the prescription been filled recently? No  Is the patient out of the medication? No  Has the patient been seen for an appointment in the last year OR does the patient have an upcoming appointment? Yes  Can we respond through MyChart? Yes  Agent: Please be advised that Rx refills may take up to 3 business days. We ask that you follow-up with your pharmacy.

## 2024-01-30 MED ORDER — TACROLIMUS 0.03 % EX OINT: 1.0000 "application " | TOPICAL_OINTMENT | Freq: Two times a day (BID) | CUTANEOUS | 0 refills | Status: DC

## 2024-01-30 NOTE — Telephone Encounter (Signed)
 YOU ASKED FOR:  Requested Requested Service Description Code 1 Code 2 Plan Dates Amount Wegovy  Inj 0.25mg  Medicaid 01/29/2024 WE DENIED: Denied Denied Code 1 Code 2 Plan Service Description Dates Amount Wegovy  Inj 0.25mg  Medicaid 01/29/2024 We denied your request for:  Wegovy  Inj 0.25mg   Medical Necessity Your provider did not send us  information we requested.  On 01/29/2024, we asked your provider for the following important facts or documents: Per your health plan's criteria, this drug is covered if you meet the following: Your doctor submits medical records (for example, chart notes, assessments, treatment plans, monitoring plans) confirming you are currently on and will continue lifestyle changes (including structured nutrition and physical activity, unless physical activity is not clinically appropriate at the time the requested therapy commences). The information provided does not show that you meet the criteria listed above. Please speak with your doctor about your choices. This decision was made per the [facility]  Wegovy  Guideline.

## 2024-01-31 NOTE — Telephone Encounter (Signed)
 I called and spoke with pt to sch her an appt for August. Pt appt 04/02/2024 @ 10:45.

## 2024-02-09 ENCOUNTER — Encounter: Payer: Self-pay | Admitting: *Deleted

## 2024-02-29 ENCOUNTER — Telehealth: Payer: Self-pay | Admitting: *Deleted

## 2024-02-29 DIAGNOSIS — E66811 Obesity, class 1: Secondary | ICD-10-CM

## 2024-02-29 DIAGNOSIS — R7303 Prediabetes: Secondary | ICD-10-CM

## 2024-02-29 MED ORDER — SEMAGLUTIDE-WEIGHT MANAGEMENT 0.5 MG/0.5ML ~~LOC~~ SOAJ
0.5000 mg | SUBCUTANEOUS | 0 refills | Status: DC
Start: 2024-02-29 — End: 2024-03-01

## 2024-02-29 MED ORDER — SEMAGLUTIDE-WEIGHT MANAGEMENT 1 MG/0.5ML ~~LOC~~ SOAJ
1.0000 mg | SUBCUTANEOUS | 0 refills | Status: DC
Start: 2024-03-29 — End: 2024-03-01

## 2024-02-29 NOTE — Telephone Encounter (Addendum)
 Returned call to patient, patient stated she has been on Wegovy  0.5 mg for 2 months. Patient has started to gain weight and wants to skip Wegovy  1 mg dose and start the next higher dose. Patient was informed that I will send amessage to her pcp to discuss dosing.

## 2024-02-29 NOTE — Telephone Encounter (Deleted)
  Recent Vital Signs   LMP 07/01/2020    Past Medical History:  Diagnosis Date   Acne vulgaris    Depression    Dyshidrotic eczema    Migraine 01/12/2017   Urge incontinence      Expected Discharge Date     Diet Order     None        VTE Documentation      Work Intensity Score/Level of Care   Mobility        Significant Events    DC Barriers

## 2024-02-29 NOTE — Telephone Encounter (Signed)
 Copied from CRM [id number]. Topic: Clinical - Medication Question >> Feb 29, 2024 10:15 AM [name] wrote: Reason for CRM: Patient states she called a few weeks ago & requested to go down on her dosage. She states that she likes the medication and what it's doing and wants to know if she can skip the next dosage amount but wants to go up to the next dose higher. Please give her a call back to advise. CB #: [phone].

## 2024-02-29 NOTE — Telephone Encounter (Deleted)
 Patient states has been on the 5 [place] Progression Recent Vital Signs   LMP 07/01/2020    Past Medical History:  Diagnosis Date   Acne vulgaris    Depression    Dyshidrotic eczema    Migraine 01/12/2017   Urge incontinence      Expected Discharge Date     Diet Order     None        VTE Documentation      Work Intensity Score/Level of Care     @LEVELOFCARE @   Mobility        Significant Events    DC Barriers   Abnormal Labs:

## 2024-02-29 NOTE — Telephone Encounter (Signed)
 Won't skip next dose because this increases risk of side-effects, which she's had before.  Start Wegovy  0.5 mg weekly x 4 weeks, then start 1 mg weekly x 4 weeks.  We have an appointment in August and we'll touch base then to see how she's doing.  Ozell Kung MD 02/29/2024, 10:49 AM

## 2024-03-01 ENCOUNTER — Other Ambulatory Visit (HOSPITAL_COMMUNITY): Payer: Self-pay

## 2024-03-01 ENCOUNTER — Telehealth: Payer: Self-pay | Admitting: *Deleted

## 2024-03-01 MED ORDER — SEMAGLUTIDE-WEIGHT MANAGEMENT 1.7 MG/0.75ML ~~LOC~~ SOAJ
1.7000 mg | SUBCUTANEOUS | 3 refills | Status: DC
Start: 2024-03-30 — End: 2024-03-05

## 2024-03-01 MED ORDER — SEMAGLUTIDE-WEIGHT MANAGEMENT 1 MG/0.5ML ~~LOC~~ SOAJ
1.0000 mg | SUBCUTANEOUS | 0 refills | Status: DC
Start: 2024-03-01 — End: 2024-03-29

## 2024-03-01 NOTE — Telephone Encounter (Signed)
 Call to patient informed her that Dr. Norrine wants her to do the Wegovy  1 mg weekly for 4 weeks and then by 03/30/2024 go up to the 1.7 mg and will go from there with dosing.  Message was left with these directions.and to call the Clinics for questions.

## 2024-03-01 NOTE — Telephone Encounter (Addendum)
 Pt stated the pharmacy told her Wegovy  1.0 mg needs a PA - sending to Jada E and Annmaria S to f/u.

## 2024-03-01 NOTE — Telephone Encounter (Signed)
 Copied from CRM [id number]. Topic: Clinical - Prescription Issue >> Mar 01, 2024  9:37 AM Vivian Z wrote: Reason for CRM: Patient would like Semaglutide -Weight Management 1 MG/0.5ML SOAJ to be sent to pharmacy below instead of [facility].  [facility] [address], [address] [place] [address] Phone: [phone] Fax: [phone] >> Mar 01, 2024 10:38 AM Farrel B wrote: Patient called from [phone] requesting an explanation of why she was needing prior authorization for a refill of her medication Semaglutide -Weight Management 1.7 MG/0.75ML SOAJ patient stated that she originally had requested the medication be sent over to the [facility] on [address]; however when they informed her that it wouldn't be in until Monday, she decided to call back and have it sent over to [facility] in which she was informed she now needed prior authorization. I informed the patient that due to her having the prescription filled at one pharmacy and it already being processed the insurance system recognized it as same prescription different pharmacies. Patient stated she was going to call [facility] and just tell them to go ahead continue filling the prescription and she'd pick it up on Monday.

## 2024-03-01 NOTE — Addendum Note (Signed)
 Addended by: NORRINE SHARPER on: 03/01/2024 07:18 AM   Modules accepted: Orders

## 2024-03-01 NOTE — Telephone Encounter (Signed)
 Step up dose to 1 mg every week x 4 doses starting now.  Starting August 9 she'll start 1.7 mg weekly with plans to step up dose again after that.  I don't recommend skipping dose increases because of risk of adverse effects. If she's happy with the effects of the medication increasing slowly is the best strategy to ensure she enjoys benefits over the long-term.  Ozell Kung MD 03/01/2024, 7:18 AM

## 2024-03-04 ENCOUNTER — Other Ambulatory Visit (HOSPITAL_COMMUNITY): Payer: Self-pay

## 2024-03-05 ENCOUNTER — Ambulatory Visit (INDEPENDENT_AMBULATORY_CARE_PROVIDER_SITE_OTHER)

## 2024-03-05 ENCOUNTER — Encounter (HOSPITAL_COMMUNITY): Payer: Self-pay

## 2024-03-05 ENCOUNTER — Ambulatory Visit: Payer: Self-pay

## 2024-03-05 ENCOUNTER — Other Ambulatory Visit (HOSPITAL_COMMUNITY): Payer: Self-pay

## 2024-03-05 VITALS — BP 110/81 | HR 95 | Temp 98.6°F | Ht 63.0 in | Wt 135.8 lb

## 2024-03-05 DIAGNOSIS — L309 Dermatitis, unspecified: Secondary | ICD-10-CM | POA: Diagnosis not present

## 2024-03-05 DIAGNOSIS — R635 Abnormal weight gain: Secondary | ICD-10-CM

## 2024-03-05 DIAGNOSIS — R7303 Prediabetes: Secondary | ICD-10-CM

## 2024-03-05 LAB — POCT GLYCOSYLATED HEMOGLOBIN (HGB A1C): Hemoglobin A1C: 5.4 % (ref 4.0–5.6)

## 2024-03-05 LAB — GLUCOSE, CAPILLARY: Glucose-Capillary: 66 mg/dL — ABNORMAL LOW (ref 70–99)

## 2024-03-05 MED ORDER — WEGOVY 0.25 MG/0.5ML ~~LOC~~ SOAJ
0.2500 mg | SUBCUTANEOUS | 0 refills | Status: DC
Start: 2024-03-05 — End: 2024-04-08
  Filled 2024-03-05 – 2024-03-08 (×2): qty 2, 28d supply, fill #0

## 2024-03-05 MED ORDER — TACROLIMUS 0.03 % EX OINT
1.0000 "application " | TOPICAL_OINTMENT | Freq: Two times a day (BID) | CUTANEOUS | 5 refills | Status: DC
  Filled 2024-03-05: qty 30, 30d supply, fill #0

## 2024-03-05 NOTE — Assessment & Plan Note (Signed)
 Patient has been using Tacrolimus  cream on her legs for eczema and has noticed near complete resolution of her symptoms. She is very happy with this cream and requests refill.  - Tacrolimus  cream refilled

## 2024-03-05 NOTE — Assessment & Plan Note (Signed)
 Patient has successfully seen 40 lb. Weight loss with Wegovy . A1C today is 5.4, improved from 5.7 one year ago. She is no longer in the pre-diabetic range and she continues to work on her diet and exercise modifications.

## 2024-03-05 NOTE — Progress Notes (Signed)
 Established Patient Office Visit  Subjective   Patient ID: [name], female    DOB: [birth year]  Age: 48 y.o. MRN: [id number]  No chief complaint on file.  [name] is a 48 yr old female with a history of anxiety, depression, pre-diabetes and obesity who presents to clinic for discussion of Wegovy .  She was previously taking Wegovy  0.5 mg biweekly, and was losing weight steadily with reduced appetite. She had an episode of food poisoning while traveling in [place], so at her last visit in 12/2023 with Dr. Stephanie, she was instructed to hold her Wegovy  for 1 week, and then continue Wegovy  0.5 mg biweekly until finished with that box, with the plan to decrease to Wegovy  0.25 mg weekly thereafter. Prior Auth for Wegovy  0.25 mg was previously denied by insurance.   She has previously tried weight loss attempts on her own through walking and exercise. She was unsuccessful in weight loss reduction on her own. The patient has been successfully taking Wegovy , which has reduced her weight as she continues her diet and lifestyle modifications. In prior instances where she discontinued the medication for prior GI issues, she noticed cravings return and she gained 10 lbs back. She voices her commitment to continuing her healthy lifestyle modifications   ROS: Denies headaches, dizziness, fever, chills, runny nose, sore throat, vision changes, hearing changes, chest pain, shortness of breath, difficulty breathing, nausea, vomiting, abdominal pain. Denies increased urinary frequency, pain with urination, constipation or diarrhea. No recent falls.     Objective:     BP 110/81 (BP Location: Right Arm, Patient Position: Sitting, Cuff Size: Small)   Pulse 95   Temp 98.6 F (37 C) (Oral)   Ht 5' 3 (1.6 m)   Wt 135 lb 12.8 oz (61.6 kg)   LMP 07/01/2020   SpO2 98%   BMI 24.06 kg/m  BP Readings from Last 3 Encounters:  03/05/24 110/81  01/10/24 (!) 101/57  11/14/23 114/78   Wt Readings from  Last 3 Encounters:  03/05/24 135 lb 12.8 oz (61.6 kg)  01/10/24 140 lb (63.5 kg)  11/14/23 153 lb 3.2 oz (69.5 kg)      Physical Exam:   Constitutional: well-appearing female sitting in exam chair, in no acute distress. Ambulates without use of assistance device  HEENT: normocephalic atraumatic, mucous membranes moist Eyes: conjunctiva non-erythematous Cardiovascular: regular rate and rhythm Pulmonary/Chest: normal work of breathing on room air, lungs clear to auscultation bilaterally Abdominal: soft, non-tender, non-distended MSK: normal bulk and tone. Neurological: alert & oriented x 3 Skin: warm and dry Psych: mood calm, behavior normal, thought content normal, judgement normal    Results for orders placed or performed in visit on 03/05/24  Glucose, capillary  Result Value Ref Range   Glucose-Capillary 66 (L) 70 - 99 mg/dL   Comment 1 Notify RN    Comment 2 Document in Chart   POC Hbg A1C  Result Value Ref Range   Hemoglobin A1C 5.4 4.0 - 5.6 %   HbA1c POC (<> result, manual entry)     HbA1c, POC (prediabetic range)     HbA1c, POC (controlled diabetic range)      Last hemoglobin A1c Lab Results  Component Value Date   HGBA1C 5.4 03/05/2024      The 10-year ASCVD risk score (Arnett DK, et al., 2019) is: 0.2%    Assessment & Plan:   Problem List Items Addressed This Visit       Musculoskeletal and Integument  Eczema   Patient has been using Tacrolimus  cream on her legs for eczema and has noticed near complete resolution of her symptoms. She is very happy with this cream and requests refill.  - Tacrolimus  cream refilled      Relevant Medications   tacrolimus  (PROTOPIC ) 0.03 % ointment     Other   Weight gain   Patient continues her diet modifications and exercising routine consistently. She was taking Wegovy  0.5 mg weekly which has been extremely helpful in helping her lose weight, notably she was 180 lbs at her heaviest. About 2 months ago, she had to  hold her Wegovy  for 3 weeks after travel to [place] and subsequent GI issues. At that time, she noticed that she began to gain about 10 lbs back in that time. She has also noted that she is very sensitive to the medication, and after joint-decision making, we decided to back down to Wegovy  0.25 mg weekly injection. We will have her take this dose for 4 weeks and then follow up with us  in the clinic to see how her symptoms are.  - Discontinue Wegovy  0.5 mg weekly - Resume Wevogy 0.25 mg weekly       Pre-diabetes - Primary   Patient has successfully seen 40 lb. Weight loss with Wegovy . A1C today is 5.4, improved from 5.7 one year ago. She is no longer in the pre-diabetic range and she continues to work on her diet and exercise modifications.       Relevant Medications   Semaglutide -Weight Management (WEGOVY ) 0.25 MG/0.5ML SOAJ   Other Relevant Orders   POC Hbg A1C (Completed)    Return in about 4 weeks (around 04/02/2024) for Wegovy  recheck.    Patient discussed with Dr. Lovie, who also saw and evaluated the patient.  Doyal Miyamoto, MD [facility]  PGY-1  03/05/24, 3:12 PM

## 2024-03-05 NOTE — Patient Instructions (Signed)
 Thank you, [name] for allowing us  to provide your care today. Today we discussed the following:  - Discontinue the Wegovy  0.5 mg weekly, and we will go back to the Wegovy  0.25 mg weekly  - I have sent a refill of your Tacrolimus  eczema cream   I have ordered the following labs for you:   Lab Orders         Glucose, capillary         POC Hbg A1C       I have ordered the following medication/changed the following medications:   Stop the following medications: Medications Discontinued During This Encounter  Medication Reason   Semaglutide -Weight Management 1.7 MG/0.75ML SOAJ Dose change   traZODone  (DESYREL ) 50 MG tablet Completed Course   venlafaxine  XR (EFFEXOR  XR) 37.5 MG 24 hr capsule Completed Course     Start the following medications:  Wegovy  0.25 mg weekly   Follow up: 1 month     Should you have any questions or concerns please call the Internal Medicine Clinic at [phone].     Doyal Miyamoto, MD [facility]

## 2024-03-05 NOTE — Assessment & Plan Note (Signed)
 Patient continues her diet modifications and exercising routine consistently. She was taking Wegovy  0.5 mg weekly which has been extremely helpful in helping her lose weight, notably she was 180 lbs at her heaviest. About 2 months ago, she had to hold her Wegovy  for 3 weeks after travel to [place] and subsequent GI issues. At that time, she noticed that she began to gain about 10 lbs back in that time. She has also noted that she is very sensitive to the medication, and after joint-decision making, we decided to back down to Wegovy  0.25 mg weekly injection. We will have her take this dose for 4 weeks and then follow up with us  in the clinic to see how her symptoms are.  - Discontinue Wegovy  0.5 mg weekly - Resume Wevogy 0.25 mg weekly

## 2024-03-05 NOTE — Telephone Encounter (Signed)
 Per the patients insurance, patient will need to have had a office visit within the last 45 days. Patient was last seen 01/10/24. Patient is scheduled to come in this afternoon to see Dr.Nguyen.  Dr.Nguyen in order for a PA to be approved with Wegovy  ,appropriate documentation must be submitted with weight loss medication prior authorizations: weight loss attempts and lifestyle changes (including diet and exercise), and how long this was utilized. This will also need to include a statement that the patient is committed to these changes and will continue efforts.

## 2024-03-06 ENCOUNTER — Other Ambulatory Visit (HOSPITAL_COMMUNITY): Payer: Self-pay

## 2024-03-06 ENCOUNTER — Telehealth: Payer: Self-pay

## 2024-03-06 NOTE — Telephone Encounter (Signed)
 Prior Authorization for patient (Wegovy  0.25MG /0.5ML auto-injectors) came through on cover my meds was submitted with last office notes awaiting approval or denial.  KEY:[id number]

## 2024-03-06 NOTE — Progress Notes (Signed)
 Internal Medicine Clinic Attending  I was physically present during the key portions of the resident provided service and participated in the medical decision making of patient's management care. I reviewed pertinent patient test results.  The assessment, diagnosis, and plan were formulated together and I agree with the documentation in the resident's note.  Lovie Clarity, MD    Patient is very sensitive to the Semaglutide , and lost significant weight even at the 0.25mg  weekly dose. She is feeling dehydrated with the 0.5mg  weekly dose, so will cut back to the 0.25mg  weekly dose, and arrange close follow up to see how her weight and symptoms are doing at the lower dose.

## 2024-03-06 NOTE — Telephone Encounter (Signed)
 [name] (Key: [id number]) Rx #: [id number] Wegovy  0.25MG /0.5ML auto-injectors Form [employer] Electronic Prior Authorization Form (2017 NCPDP) Created Sent to Plan Plan Response Submit Clinical Questions Determination Favorable Your prior authorization for Wegovy  has been approved! More Info Personalized support and financial assistance may be available through the [employer] program. For more information, and to see program requirements, click on the More Info button to the right.  Message from plan: Request Reference Number: [id number]. WEGOVY  INJ 0.25MG  is approved through 03/06/2025. For further questions, call [employer] at [phone].SABRA Authorization Expiration Date: March 06, 2025.  Patient is aware.

## 2024-03-08 ENCOUNTER — Other Ambulatory Visit (HOSPITAL_COMMUNITY): Payer: Self-pay

## 2024-04-02 ENCOUNTER — Encounter: Admitting: Student

## 2024-04-08 ENCOUNTER — Ambulatory Visit: Admitting: Student

## 2024-04-08 ENCOUNTER — Other Ambulatory Visit: Payer: Self-pay

## 2024-04-08 ENCOUNTER — Other Ambulatory Visit (HOSPITAL_COMMUNITY): Payer: Self-pay

## 2024-04-08 ENCOUNTER — Encounter: Payer: Self-pay | Admitting: Student

## 2024-04-08 VITALS — BP 95/64 | HR 97 | Temp 98.4°F | Ht 63.0 in | Wt 138.2 lb

## 2024-04-08 DIAGNOSIS — Z6824 Body mass index (BMI) 24.0-24.9, adult: Secondary | ICD-10-CM | POA: Diagnosis not present

## 2024-04-08 DIAGNOSIS — E669 Obesity, unspecified: Secondary | ICD-10-CM

## 2024-04-08 DIAGNOSIS — L309 Dermatitis, unspecified: Secondary | ICD-10-CM

## 2024-04-08 DIAGNOSIS — E66811 Obesity, class 1: Secondary | ICD-10-CM

## 2024-04-08 DIAGNOSIS — Z1211 Encounter for screening for malignant neoplasm of colon: Secondary | ICD-10-CM

## 2024-04-08 MED ORDER — WEGOVY 0.25 MG/0.5ML ~~LOC~~ SOAJ
0.2500 mg | SUBCUTANEOUS | 0 refills | Status: DC
  Filled 2024-04-08 – 2024-04-17 (×2): qty 2, 28d supply, fill #0

## 2024-04-08 MED ORDER — TACROLIMUS 0.03 % EX OINT
1.0000 "application " | TOPICAL_OINTMENT | Freq: Two times a day (BID) | CUTANEOUS | 5 refills | Status: DC
  Filled 2024-04-08: qty 30, 30d supply, fill #0

## 2024-04-08 NOTE — Progress Notes (Unsigned)
  Patient name: [name] Date of birth: 22-Dec-1975 Date of visit: 04/08/24  Subjective   Reason for visit: Wegovy   She's very sensitive to semaglutide , notes ~50-60 lbs weight lost since starting it. Takes the starting 0.25 mg dose once weekly, sometimes skipping weeks if she feels like she is losing too much weight. She consistently regains weight during her weeks off of the medication.  She has chronic   Current Outpatient Medications  Medication Instructions   tacrolimus  (PROTOPIC ) 0.03 % ointment Apply thin layer 2 times daily, discontinue when symptoms improve   Wegovy  0.25 mg, Subcutaneous, Weekly, It's okay to skip a week of the medicine if you lose too much weight     Objective  Today's Vitals   04/08/24 1447  BP: 95/64  Pulse: 97  Temp: 98.4 F (36.9 C)  TempSrc: Oral  SpO2: 100%  Weight: 138 lb 3.2 oz (62.7 kg)  Height: 5' 3 (1.6 m)  PainSc: 0-No pain  Body mass index is 24.48 kg/m.   Physical Exam   Assessment & Plan   Obesity (BMI 30.0-34.9) Assessment & Plan: Body mass index is 24.48 kg/m.  I recommend eliminating sugary beverages like soda, sweet tea, and juice entirely. I recommended more vegetables, lean protein, and legumes. Frozen vegetables are healthy and inexpensive. Beans are a healthy and inexpensive source of lean protein and fiber. I recommend gradually increasing exercise. A daily walk is a great way to start an exercise program.  Referral: last encounter with dietitian in March 2025  Pharmacological intervention: continue low-dose Wegovy    Orders: -     Wegovy ; Inject 0.25 mg into the skin once a week. It's okay to skip a week of the medicine if you lose too much weight  Dispense: 2 mL; Refill: 0  Chronic dermatitis Assessment & Plan: Chronic, stable. Affects the feet. Has used tacrolimus  cream for flares for years to good effect. Refilled this today.  Orders: -     Tacrolimus ; Apply thin layer 2 times daily, discontinue when  symptoms improve  Dispense: 30 g; Refill: 5  Screening for malignant neoplasm of colon -     Ambulatory referral to Gastroenterology    No follow-ups on file.  Ozell Kung MD 04/08/2024, 5:53 PM

## 2024-04-08 NOTE — Assessment & Plan Note (Signed)
 Chronic, stable. Affects the feet. Has used tacrolimus  cream for flares for years to good effect. Refilled this today.

## 2024-04-08 NOTE — Assessment & Plan Note (Signed)
 Body mass index is 24.48 kg/m.  I recommend eliminating sugary beverages like soda, sweet tea, and juice entirely. I recommended more vegetables, lean protein, and legumes. Frozen vegetables are healthy and inexpensive. Beans are a healthy and inexpensive source of lean protein and fiber. I recommend gradually increasing exercise. A daily walk is a great way to start an exercise program.  Referral: last encounter with dietitian in March 2025  Pharmacological intervention: continue low-dose Wegovy 

## 2024-04-08 NOTE — Patient Instructions (Signed)
 Remember to bring all of the medications that you take (including over the counter medications and supplements) with you to every clinic visit.  This after visit summary is an important review of tests, referrals, and medication changes that were discussed during your visit. If you have questions or concerns, call [phone]. Outside of clinic business hours, call the main hospital at [phone] and ask the operator for the on-call internal medicine resident.   Ozell Kung MD 04/08/2024, 3:29 PM

## 2024-04-09 ENCOUNTER — Other Ambulatory Visit (HOSPITAL_COMMUNITY): Payer: Self-pay

## 2024-04-09 NOTE — Progress Notes (Signed)
 Internal Medicine Clinic Attending  Case discussed with the resident at the time of the visit.  We reviewed the resident's history and exam and pertinent patient test results.  I agree with the assessment, diagnosis, and plan of care documented in the resident's note.  Jone Dauphin MD

## 2024-04-17 ENCOUNTER — Other Ambulatory Visit (HOSPITAL_COMMUNITY): Payer: Self-pay

## 2024-06-13 ENCOUNTER — Ambulatory Visit: Payer: Self-pay

## 2024-06-13 ENCOUNTER — Telehealth (HOSPITAL_COMMUNITY): Payer: Self-pay

## 2024-06-13 ENCOUNTER — Other Ambulatory Visit (HOSPITAL_COMMUNITY): Payer: Self-pay

## 2024-06-13 VITALS — BP 118/82 | HR 76 | Temp 98.6°F | Ht 63.0 in | Wt 143.6 lb

## 2024-06-13 DIAGNOSIS — E669 Obesity, unspecified: Secondary | ICD-10-CM

## 2024-06-13 DIAGNOSIS — Z79899 Other long term (current) drug therapy: Secondary | ICD-10-CM

## 2024-06-13 DIAGNOSIS — E66811 Obesity, class 1: Secondary | ICD-10-CM

## 2024-06-13 DIAGNOSIS — R3915 Urgency of urination: Secondary | ICD-10-CM

## 2024-06-13 DIAGNOSIS — L309 Dermatitis, unspecified: Secondary | ICD-10-CM

## 2024-06-13 DIAGNOSIS — N951 Menopausal and female climacteric states: Secondary | ICD-10-CM | POA: Diagnosis not present

## 2024-06-13 MED ORDER — WEGOVY 0.25 MG/0.5ML ~~LOC~~ SOAJ
0.2500 mg | SUBCUTANEOUS | 0 refills | Status: AC
Start: 2024-06-13 — End: ?
  Filled 2024-06-13: qty 3, 30d supply, fill #0
  Filled 2024-06-13: qty 3, 28d supply, fill #0
  Filled 2024-06-14: qty 3, 42d supply, fill #0

## 2024-06-13 MED ORDER — TACROLIMUS 0.03 % EX OINT
1.0000 | TOPICAL_OINTMENT | Freq: Two times a day (BID) | CUTANEOUS | 5 refills | Status: AC
  Filled 2024-06-13: qty 30, 30d supply, fill #0
  Filled 2024-08-06: qty 60, 30d supply, fill #1
  Filled 2024-08-07: qty 30, 30d supply, fill #1

## 2024-06-13 MED ORDER — OXYBUTYNIN CHLORIDE ER 10 MG PO TB24
10.0000 mg | ORAL_TABLET | Freq: Every day | ORAL | 2 refills | Status: AC
  Filled 2024-06-13: qty 30, 30d supply, fill #0

## 2024-06-13 MED ORDER — VENLAFAXINE HCL ER 37.5 MG PO CP24
37.5000 mg | ORAL_CAPSULE | Freq: Every day | ORAL | 2 refills | Status: AC
  Filled 2024-06-13: qty 30, 30d supply, fill #0

## 2024-06-13 NOTE — Patient Instructions (Addendum)
 Today we discussed the following medical conditions and plan:   - wegovy  every 3 weeks - continue exercise and dietary changes as discussed  - oxybutinyn for bladder 5 mg daily  - Venlafaxine  37.5 mg daily for anxiety and hot flashes - I refilled your cream  3 week follow up. We will discuss: Colonoscopy, pap smear.   We look forward to seeing you next time. Please call our clinic at [phone] if you have any questions or concerns. The best time to call is Monday-Friday from 9am-4pm, but there is someone available 24/7. If you need medication refills, please notify your pharmacy one week in advance and they will send us  a request.   Thank you for trusting me with your care. Wishing you the best!   Sallyanne Primas, DO  [facility]

## 2024-06-13 NOTE — Progress Notes (Unsigned)
 CC: discuss Wigovy and difficulty holding urine.   HPI:  [name] is a 48 y.o. female with pertinent past medical history of anxiety and depression, chronic dermatitis, menopausal vasomotor syndrome (further medical history stated below) and presents today for discussion of Wegovy  and difficulty holding urine. Please see problem based assessment and plan for additional details.  Last Pertinent Labs Documented:     Latest Ref Rng & Units 04/06/2023   10:49 AM 05/05/2022   11:27 AM 04/21/2012    1:16 PM  BMP  Glucose 70 - 99 mg/dL 77  82  87   BUN 6 - 24 mg/dL 13  13  11    Creatinine 0.57 - 1.00 mg/dL 9.05  9.21  9.22   BUN/Creat Ratio 9 - 23 14  17     Sodium 134 - 144 mmol/L 140  141  139   Potassium 3.5 - 5.2 mmol/L 4.9  5.1  3.7   Chloride 96 - 106 mmol/L 103  103  107   CO2 20 - 29 mmol/L 24  24  23    Calcium 8.7 - 10.2 mg/dL 9.5  9.6  8.9        Latest Ref Rng & Units 07/23/2020    1:42 PM 06/01/2020    2:58 PM 04/21/2012    1:16 PM  CBC  WBC 3.4 - 10.8 x10E3/uL 4.3  7.6  4.3   Hemoglobin 11.1 - 15.9 g/dL 87.9  87.0  87.2   Hematocrit 34.0 - 46.6 % 36.6  39.2  36.0   Platelets 150 - 450 x10E3/uL 160  201  153     Lab Results  Component Value Date   HGBA1C 5.4 03/05/2024   HGBA1C 5.7 (H) 04/06/2023   HGBA1C 5.5 05/05/2022     Past Medical History:  Diagnosis Date   Acne vulgaris    Depression    Dyshidrotic eczema    Migraine 01/12/2017   Obesity (BMI 30.0-34.9) 07/04/2023   Urge incontinence     No current outpatient medications on file prior to visit.   No current facility-administered medications on file prior to visit.    Family History  Problem Relation Age of Onset   Breast cancer Mother 26   Stroke Mother        age 70 when she had the stroke   Hyperlipidemia Father     Social History   Socioeconomic History   Marital status: Single    Spouse name: Not on file   Number of children: Not on file   Years of education: Not on file    Highest education level: Not on file  Occupational History   Not on file  Tobacco Use   Smoking status: Never   Smokeless tobacco: Never  Substance and Sexual Activity   Alcohol use: Yes    Alcohol/week: 0.0 standard drinks of alcohol    Comment: Occasionally.   Drug use: Never   Sexual activity: Not on file  Other Topics Concern   Not on file  Social History Narrative   Has 2 children.   Father's children passed away from ruptured aneurysm in 2005.   Works at [employer].   Social Drivers of Corporate investment banker Strain: Low Risk  (05/05/2023)   Overall Financial Resource Strain (CARDIA)    Difficulty of Paying Living Expenses: Not hard at all  Food Insecurity: No Food Insecurity (05/05/2023)   Hunger Vital Sign    Worried About Programme researcher, broadcasting/film/video in  the Last Year: Never true    Ran Out of Food in the Last Year: Never true  Transportation Needs: No Transportation Needs (05/05/2023)   PRAPARE - Administrator, Civil Service (Medical): No    Lack of Transportation (Non-Medical): No  Physical Activity: Sufficiently Active (05/05/2023)   Exercise Vital Sign    Days of Exercise per Week: 3 days    Minutes of Exercise per Session: 90 min  Stress: No Stress Concern Present (05/05/2023)   [employer] of Occupational Health - Occupational Stress Questionnaire    Feeling of Stress : Not at all  Social Connections: Moderately Integrated (05/05/2023)   Social Connection and Isolation Panel    Frequency of Communication with Friends and Family: More than three times a week    Frequency of Social Gatherings with Friends and Family: More than three times a week    Attends Religious Services: More than 4 times per year    Active Member of [employer] or Organizations: Yes    Attends Banker Meetings: Never    Marital Status: Never married  Intimate Partner Violence: Not At Risk (05/05/2023)   Humiliation, Afraid, Rape, and Kick questionnaire    Fear of  Current or Ex-Partner: No    Emotionally Abused: No    Physically Abused: No    Sexually Abused: No    Review of Systems: As stated below   Vitals:   06/13/24 0909  BP: 118/82  Pulse: 76  Temp: 98.6 F (37 C)  TempSrc: Oral  SpO2: 98%  Weight: 143 lb 9.6 oz (65.1 kg)  Height: 5' 3 (1.6 m)    Physical Exam: Physical Exam Constitutional:      General: She is not in acute distress.    Appearance: She is not ill-appearing or toxic-appearing.  Cardiovascular:     Rate and Rhythm: Normal rate and regular rhythm.     Heart sounds: Normal heart sounds. No murmur heard.    No friction rub. No gallop.  Pulmonary:     Effort: Pulmonary effort is normal.     Breath sounds: Normal breath sounds. No stridor. No wheezing, rhonchi or rales.  Abdominal:     General: Abdomen is flat. Bowel sounds are normal. There is no distension.     Palpations: Abdomen is soft.     Tenderness: There is no abdominal tenderness. There is no guarding.  Musculoskeletal:     Right lower leg: No edema.     Left lower leg: No edema.  Skin:    General: Skin is warm and dry.  Neurological:     Mental Status: She is alert.      Assessment & Plan:   Patient seen with Dr. Jeanelle Assessment & Plan Obesity (BMI 30.0-34.9) On Wegovy  0.25, but stated she started losing too much weight.  Went from 186 pounds to 120 pounds.  When she got 120 pounds she started taking it every 2 to 3 weeks with increasing weight to 143 pounds.  Patient feels overall better not being at 120 pounds, but is frustrated with increased cravings and eating with spaced out injections.  Denies nausea, vomiting, constipation.  She has joined a workout group which she said is providing good support and accountability.  Denies taking her blood sugars at home.  Discussed with patient the importance of lifestyle changes in addition to being on weight loss medication.  Last A1c 5.4.  Insurance will likely not pay for future dosing of  Wegovy . -encouraged  continued use of Wegovy  once every 3 weeks due to sensitivity to weight loss.  -Encouraged lifestyle changes such as increasing exercise, eating healthy, and cutting down on sodas. Urinary urgency For over a year, describes a urinary urgency all throughout the day.  Denies dysuria or hematuria. she feels as though she is emptying her bladder fully when going to the restroom, however if she does not go frequently she has had incontinent episodes and trickling.  She describes times where she has not mated to the bathroom on time or has had to pull over from driving to pee.  She goes to the restroom about 5 times a night.  Denies vaginal dryness.  Does not currently use a pelvic floor therapist.  Denies trickling or urinating with laughter or sneezing, however if she knows her bladder is full those actions might cause her to trickle.  This is likely urge incontinence with overactive bladder and difficulty holding onto urine.  Interested in starting medication management.  Also counseled on benefit of training bladder.  - Started oxybutynin  5 mg daily Menopausal vasomotor syndrome Entered menopause at age 89.  Experiencing hot flashes throughout the day and at night which have been bothersome.  Endorsed anxiety.  Denies vaginal dryness.  Denied suicidal ideation or homicidal ideation. -Started venlafaxine  37.5 mg Chronic dermatitis -Refilled tacrolimus    No orders of the defined types were placed in this encounter.    Sallyanne Primas, D.O. [facility], PGY-1 Date 06/14/2024 Time 3:44 PM

## 2024-06-14 ENCOUNTER — Other Ambulatory Visit (HOSPITAL_COMMUNITY): Payer: Self-pay

## 2024-06-14 DIAGNOSIS — R3915 Urgency of urination: Secondary | ICD-10-CM | POA: Insufficient documentation

## 2024-06-14 NOTE — Assessment & Plan Note (Signed)
Refilled tacrolimus

## 2024-06-14 NOTE — Assessment & Plan Note (Signed)
 Entered menopause at age 48.  Experiencing hot flashes throughout the day and at night which have been bothersome.  Endorsed anxiety.  Denies vaginal dryness.  Denied suicidal ideation or homicidal ideation. -Started venlafaxine  37.5 mg

## 2024-06-14 NOTE — Assessment & Plan Note (Signed)
 On Wegovy  0.25, but stated she started losing too much weight.  Went from 186 pounds to 120 pounds.  When she got 120 pounds she started taking it every 2 to 3 weeks with increasing weight to 143 pounds.  Patient feels overall better not being at 120 pounds, but is frustrated with increased cravings and eating with spaced out injections.  Denies nausea, vomiting, constipation.  She has joined a workout group which she said is providing good support and accountability.  Denies taking her blood sugars at home.  Discussed with patient the importance of lifestyle changes in addition to being on weight loss medication.  Last A1c 5.4.  Insurance will likely not pay for future dosing of Wegovy . -encouraged continued use of Wegovy  once every 3 weeks due to sensitivity to weight loss.  -Encouraged lifestyle changes such as increasing exercise, eating healthy, and cutting down on sodas.

## 2024-06-14 NOTE — Assessment & Plan Note (Signed)
 For over a year, describes a urinary urgency all throughout the day.  Denies dysuria or hematuria. she feels as though she is emptying her bladder fully when going to the restroom, however if she does not go frequently she has had incontinent episodes and trickling.  She describes times where she has not mated to the bathroom on time or has had to pull over from driving to pee.  She goes to the restroom about 5 times a night.  Denies vaginal dryness.  Does not currently use a pelvic floor therapist.  Denies trickling or urinating with laughter or sneezing, however if she knows her bladder is full those actions might cause her to trickle.  This is likely urge incontinence with overactive bladder and difficulty holding onto urine.  Interested in starting medication management.  Also counseled on benefit of training bladder.  - Started oxybutynin  5 mg daily

## 2024-06-20 NOTE — Progress Notes (Signed)
 Internal Medicine Clinic Attending  I was physically present during the key portions of the resident provided service and participated in the medical decision making of patient's management care. I reviewed pertinent patient test results.  The assessment, diagnosis, and plan were formulated together and I agree with the documentation in the resident's note.  Jeanelle Layman CROME, MD

## 2024-06-24 ENCOUNTER — Telehealth (HOSPITAL_COMMUNITY): Payer: Self-pay

## 2024-06-24 ENCOUNTER — Other Ambulatory Visit (HOSPITAL_COMMUNITY): Payer: Self-pay

## 2024-06-24 NOTE — Telephone Encounter (Signed)
 Pharmacy Patient Advocate Encounter   Received notification from Pt Calls Messages that prior authorization for Wegovy  0.25 mg/0.5 ml auto injectors is required/requested.   Insurance verification completed.   The patient is insured through [facility] MEDICAID.   Per test claim: Effective October 1st, Medicaid discontinued coverage of GLP1 medications for weight loss (such as Wegovy  and Zepbound), unless the patient has a documented history of a heart attack or stroke. Zepbound will continue to be covered only for patients with moderate to severe sleep apnea (AHI 15-30) and a BMI greater than 40. Because of this change, the prior authorization team will not be submitting new PA requests for GLP1 medications prescribed for weight loss, as patients will be unable to continue therapy under Medicaid coverage.

## 2024-07-15 ENCOUNTER — Encounter: Payer: Self-pay | Admitting: Internal Medicine

## 2024-08-06 ENCOUNTER — Other Ambulatory Visit (HOSPITAL_COMMUNITY): Payer: Self-pay

## 2024-08-07 ENCOUNTER — Other Ambulatory Visit (HOSPITAL_COMMUNITY): Payer: Self-pay

## 2024-08-08 ENCOUNTER — Other Ambulatory Visit (HOSPITAL_COMMUNITY): Payer: Self-pay

## 2024-09-25 ENCOUNTER — Other Ambulatory Visit: Payer: Self-pay

## 2024-09-25 ENCOUNTER — Ambulatory Visit: Payer: Self-pay

## 2024-09-25 ENCOUNTER — Encounter (HOSPITAL_COMMUNITY): Payer: Self-pay | Admitting: *Deleted

## 2024-09-25 ENCOUNTER — Emergency Department (HOSPITAL_COMMUNITY)
Admission: EM | Admit: 2024-09-25 | Discharge: 2024-09-25 | Disposition: A | Discharge status: Home / Self Care | Attending: Emergency Medicine | Admitting: Emergency Medicine

## 2024-09-25 DIAGNOSIS — T383X1A Poisoning by insulin and oral hypoglycemic [antidiabetic] drugs, accidental (unintentional), initial encounter: Secondary | ICD-10-CM | POA: Insufficient documentation

## 2024-09-25 DIAGNOSIS — T50901A Poisoning by unspecified drugs, medicaments and biological substances, accidental (unintentional), initial encounter: Secondary | ICD-10-CM

## 2024-09-25 MED ORDER — ONDANSETRON 4 MG PO TBDP: 4.0000 mg | ORAL_TABLET | Freq: Three times a day (TID) | ORAL | 0 refills | Status: AC | PRN

## 2024-09-25 MED ORDER — ONDANSETRON 4 MG PO TBDP
4.0000 mg | ORAL_TABLET | Freq: Once | ORAL | Status: AC
  Administered 2024-09-25: 4 mg via ORAL
  Filled 2024-09-25: qty 1

## 2024-09-25 NOTE — Telephone Encounter (Signed)
 Call to patient stated that she took 2.5 mg Wegovy .  More that she usually takes.  Is feeling weak, Nausea and vomiting .  Is on the way to the ER.

## 2024-09-25 NOTE — ED Triage Notes (Signed)
" °  Less nausea now   "

## 2024-09-25 NOTE — Discharge Instructions (Signed)
 You took an increased dose of Wegovy  that was more than you typically take. This is causing your symptoms today. It takes about 1 week for the symptoms to subside/for the medication to get out of your system.  Eat small meals at home to not upset your stomach.   You have been prescribed Zofran  (ondansetron ) for nausea and vomiting. You may take this every 8 hours as needed for nausea and vomiting. This medication dissolves under the tongue. You do not need to swallow it.  You were given your first dose here today, next dose can be at 2am.  If you have any further concerns or have any further medication errors/ingestions, please call the Poison control center at [phone].  Please return to the ER for any uncontrolled vomiting or any other concerning symptoms

## 2024-09-25 NOTE — ED Provider Notes (Signed)
 " [place] EMERGENCY DEPARTMENT AT [facility] Provider Note   CSN: [id number] Arrival date & time: 09/25/24  1532     Patient presents with: Took too much Wagovy   [name] is a 49 y.o. female with history of obesity presents with concern for administering 1.7mg  of her Wegovy  instead of 0.5mg  this morning. Denies any SI, reports she did this in error not realizing it wasn't her normal dose. Reports since taking this increased dose, she is having nausea, vomiting, and feels shaky. No abdominal pain. Reports normal bowel movements. No fever or chills.    HPI     Prior to Admission medications  Medication Sig Start Date End Date Taking? Authorizing Provider  ondansetron  (ZOFRAN -ODT) 4 MG disintegrating tablet Take 1 tablet (4 mg total) by mouth every 8 (eight) hours as needed for nausea or vomiting. 09/25/24  Yes Veta Palma, PA-C  oxybutynin  (DITROPAN  XL) 10 MG 24 hr tablet Take 1 tablet (10 mg total) by mouth daily. 06/13/24 06/13/25  Rihner, Emilie, DO  semaglutide -weight management (WEGOVY ) 0.25 MG/0.5ML SOAJ SQ injection Inject 0.25 mg into the skin once a week. It's okay to skip a week of the medicine if you lose too much weight 06/13/24   Rihner, Emilie, DO  tacrolimus  (PROTOPIC ) 0.03 % ointment Apply a thin layer 2 times daily, discontinue when symptoms improve 06/13/24   Rihner, Emilie, DO  venlafaxine  XR (EFFEXOR  XR) 37.5 MG 24 hr capsule Take 1 capsule (37.5 mg total) by mouth daily. 06/13/24 06/13/25  Rihner, Emilie, DO    Allergies: Patient has no known allergies.    Review of Systems  Gastrointestinal:  Positive for nausea.    Updated Vital Signs BP (!) 150/90 (BP Location: Right Arm)   Pulse 74   Temp 98.3 F (36.8 C) (Oral)   Resp 18   Ht 5' 3 (1.6 m)   Wt 65.1 kg   LMP 07/01/2020   SpO2 100%   BMI 25.42 kg/m   Physical Exam Vitals and nursing note reviewed.  Constitutional:      General: She is not in acute distress.    Appearance:  Normal appearance.  HENT:     Head: Atraumatic.     Mouth/Throat:     Mouth: Mucous membranes are moist.     Pharynx: No oropharyngeal exudate or posterior oropharyngeal erythema.  Cardiovascular:     Rate and Rhythm: Normal rate and regular rhythm.  Pulmonary:     Effort: Pulmonary effort is normal.     Breath sounds: Normal breath sounds.  Abdominal:     General: Abdomen is flat. Bowel sounds are normal. There is no distension.     Palpations: Abdomen is soft.     Tenderness: There is no abdominal tenderness. There is no guarding.     Comments: No active vomiting  Skin:    General: Skin is warm and dry.  Neurological:     General: No focal deficit present.     Mental Status: She is alert and oriented to person, place, and time.  Psychiatric:        Mood and Affect: Mood normal.        Behavior: Behavior normal.     (all labs ordered are listed, but only abnormal results are displayed) Labs Reviewed - No data to display  EKG: None  Radiology: No results found.   Procedures   Medications Ordered in the ED  ondansetron  (ZOFRAN -ODT) disintegrating tablet 4 mg (4 mg Oral Given  09/25/24 1611)                                    Medical Decision Making Risk Prescription drug management.     Differential diagnosis includes but is not limited to medication overdose, wegovy  adverse reaction, SBO, suicide attempt  ED Course:  Upon initial evaluation, patient is well appearing.  Stable vitals aside from elevated blood pressure 150/90 upon arrival. She denies any SI, this was just a medication error. She appears nauseous, but no active vomiting.  Abdomen is soft and nontender.  Poison Control Center was contacted initially in triage by myself, they recommended supportive care for the patient.  Poison control did not recommend any labs, observation, or any specific treatment for patient in the emergency room. Patient provided Zofran  for her nausea and PO challenged with  water and crackers and she tolerated this well.  I do not feel patient needs any further labs or imaging at this time given symptoms started after taking her increased dose of Wegovy  this morning and symptoms of nausea, vomiting are consistent with Wegovy  side effects.  Patient reports normal bowel movements, tolerating p.o. intake, her abdomen is soft and nontender, I have low concern for acute intra-abdominal pathology such as SBO.  Strict return precautions were discussed with the patient, and she reports understanding.  Stable and appropriate for discharge home at this time  Medications Given: Zofran   Impression: Wegovy  adverse reaction   Disposition:  The patient was discharged home with instructions to take Zofran  as needed for nausea and vomiting.  Eats small meals to help with the nausea, avoid large meals.  She may contact poison control for further guidance if she develops any new symptoms, their contact information was provided. Return precautions given and patient verbalized understanding.    This chart was dictated using voice recognition software, Dragon. Despite the best efforts of this provider to proofread and correct errors, errors may still occur which can change documentation meaning.       Final diagnoses:  Medication administered in error, accidental or unintentional, initial encounter    ED Discharge Orders          Ordered    ondansetron  (ZOFRAN -ODT) 4 MG disintegrating tablet  Every 8 hours PRN        09/25/24 1657               Veta Palma, PA-C 09/25/24 1705  "

## 2024-09-25 NOTE — ED Provider Triage Note (Cosign Needed Addendum)
 Emergency Medicine Provider Triage Evaluation Note  [name] , a 49 y.o. female  was evaluated in triage.  Pt complains of administering 1.7mg  of her Wegovy  instead of 0.5mg  this morning. Reports since taking this increased dose, she is having nausea, vomiting, and feels shaky. No abdominal pain. Reports normal bowel movements.   Review of Systems  Positive: As above Negative: As above  Physical Exam  BP (!) 150/90 (BP Location: Right Arm)   Pulse 74   Temp 98.3 F (36.8 C) (Oral)   Resp 18   Ht 5' 3 (1.6 m)   Wt 65.1 kg   LMP 07/01/2020   SpO2 100%   BMI 25.42 kg/m  Gen:   Awake, no distress. No active vomiting  Resp:  Normal effort  MSK:   Moves extremities without difficulty  Other:  Abdomen is soft and non-tender   Medical Decision Making  Medically screening exam initiated at 4:17 PM.  Appropriate orders placed.  Chet LITTIE Sharps was informed that the remainder of the evaluation will be completed by another provider, this initial triage assessment does not replace that evaluation, and the importance of remaining in the ED until their evaluation is complete.  Spoke to poison control recommends supportive care. No labs or monitoring required. Small and frequent meals due to decreased gastric motility from medication.    Veta Palma, PA-C 09/25/24 1617    Veta Palma, PA-C 09/25/24 [phone]

## 2024-09-25 NOTE — Telephone Encounter (Signed)
 FYI Only or Action Required?: FYI only for provider: ED advised.  Patient was last seen in primary care on 06/13/2024 by Rihner, Emilie, DO.  Called Nurse Triage reporting Dizziness, Nausea, Vomiting, and Blurred Vision.  Symptoms began today.  Interventions attempted: Prescription medications: relates to taking wrong dose of Wegovy  and Rest, hydration, or home remedies.  Symptoms are: gradually worsening.  Triage Disposition: Go to ED Now (Notify PCP), Go to ED Now (or PCP Triage)  Patient/caregiver understands and will follow disposition?: Yes   Message from [facility] sent at 09/25/2024  2:07 PM EST  Summary: nausea, blurry, diarrhea, dizzy, vomiting   Reason for Triage: nausea, blurry, diarrhea, dizzy, vomiting ...resulting from after diarrhea         Reason for Disposition  Patient sounds very sick or weak to the triager  Loss of vision or double vision  (Exception: Similar to previous migraines.)    Having vision changes.  Answer Assessment - Initial Assessment Questions DESCRIPTION: How has your vision changed? (e.g., complete vision loss, blurred vision, double vision, floaters, etc.)     Initially reports blurred vision, then also states everything appears bright.   LOCATION: One or both eyes? If one, ask: Which eye?     Both eyes  SEVERITY: Can you see anything? If Yes, ask: What can you see? (e.g., fine print)     Sees everything brighter now.   ONSET: When did this begin? Did it start suddenly or has this been gradual?     Relates to accidentally taking higher dose (old dose) strength of Wegovy  than is supposed to.  Reports having had these similar symptoms in the past with higher strength dose.    PAIN: Is there any pain in your eye(s)?  (Scale 1-10; or mild, moderate, severe)     Denies any pain.    CAUSE: What do you think is causing this visual problem?     Medication dose.    OTHER SYMPTOMS: Do you have any other symptoms?  (e.g., confusion, headache, arm or leg weakness, speech problems)     Generalized weakness (not unilateral); dizziness (mild currently, was able to drive self home); nausea, diarrhea, vomitting  Also reports multiple times doesn't feel like self and feels like something is going to happen.  Answer Assessment - Initial Assessment Questions See alternative assessment for vision changes.  Protocols used: Vision Loss or Change-A-AH, Dizziness - Vertigo-A-AH

## 2024-09-25 NOTE — ED Triage Notes (Signed)
 Pt took too much of her Wagovy by mistake at home & is now having n/v/d, shakiness & dizziness.
